# Patient Record
Sex: Male | Born: 1970 | Race: White | Hispanic: No | Marital: Married | Smoking: Former smoker
Health system: Southern US, Community
[De-identification: ages and names within clinical notes are randomized; demographics above are authoritative.]

## PROBLEM LIST (undated history)

## (undated) DIAGNOSIS — F410 Panic disorder [episodic paroxysmal anxiety] without agoraphobia: Secondary | ICD-10-CM

## (undated) DIAGNOSIS — E785 Hyperlipidemia, unspecified: Secondary | ICD-10-CM

## (undated) DIAGNOSIS — R5383 Other fatigue: Secondary | ICD-10-CM

## (undated) DIAGNOSIS — E669 Obesity, unspecified: Secondary | ICD-10-CM

## (undated) DIAGNOSIS — I10 Essential (primary) hypertension: Secondary | ICD-10-CM

## (undated) DIAGNOSIS — Z87898 Personal history of other specified conditions: Secondary | ICD-10-CM

## (undated) DIAGNOSIS — K279 Peptic ulcer, site unspecified, unspecified as acute or chronic, without hemorrhage or perforation: Secondary | ICD-10-CM

## (undated) DIAGNOSIS — F439 Reaction to severe stress, unspecified: Secondary | ICD-10-CM

## (undated) HISTORY — DX: Reaction to severe stress, unspecified: F43.9

## (undated) HISTORY — DX: Essential (primary) hypertension: I10

## (undated) HISTORY — DX: Panic disorder (episodic paroxysmal anxiety): F41.0

## (undated) HISTORY — DX: Other fatigue: R53.83

## (undated) HISTORY — DX: Personal history of other specified conditions: Z87.898

## (undated) HISTORY — DX: Peptic ulcer, site unspecified, unspecified as acute or chronic, without hemorrhage or perforation: K27.9

## (undated) HISTORY — PX: APPENDECTOMY: SHX54

## (undated) HISTORY — DX: Hyperlipidemia, unspecified: E78.5

## (undated) HISTORY — DX: Obesity, unspecified: E66.9

---

## 2001-12-17 ENCOUNTER — Encounter: Payer: Self-pay | Admitting: Family Medicine

## 2001-12-17 ENCOUNTER — Ambulatory Visit (HOSPITAL_COMMUNITY): Admission: RE | Admit: 2001-12-17 | Discharge: 2001-12-17 | Payer: Self-pay | Admitting: Family Medicine

## 2002-09-08 ENCOUNTER — Ambulatory Visit (HOSPITAL_COMMUNITY): Admission: RE | Admit: 2002-09-08 | Discharge: 2002-09-08 | Payer: Self-pay | Admitting: Internal Medicine

## 2002-09-11 ENCOUNTER — Encounter: Payer: Self-pay | Admitting: Internal Medicine

## 2002-09-11 ENCOUNTER — Encounter (HOSPITAL_COMMUNITY): Admission: RE | Admit: 2002-09-11 | Discharge: 2002-10-11 | Payer: Self-pay | Admitting: Internal Medicine

## 2002-09-20 ENCOUNTER — Inpatient Hospital Stay (HOSPITAL_COMMUNITY): Admission: EM | Admit: 2002-09-20 | Discharge: 2002-09-21 | Payer: Self-pay | Admitting: Emergency Medicine

## 2002-09-21 ENCOUNTER — Encounter: Payer: Self-pay | Admitting: Family Medicine

## 2002-09-28 ENCOUNTER — Encounter: Payer: Self-pay | Admitting: Internal Medicine

## 2002-12-04 ENCOUNTER — Inpatient Hospital Stay (HOSPITAL_COMMUNITY): Admission: EM | Admit: 2002-12-04 | Discharge: 2002-12-08 | Payer: Self-pay | Admitting: Emergency Medicine

## 2002-12-04 ENCOUNTER — Encounter: Payer: Self-pay | Admitting: Cardiology

## 2010-02-07 ENCOUNTER — Emergency Department (HOSPITAL_COMMUNITY): Admission: EM | Admit: 2010-02-07 | Discharge: 2010-02-07 | Payer: Self-pay | Admitting: Emergency Medicine

## 2010-02-15 ENCOUNTER — Ambulatory Visit (HOSPITAL_COMMUNITY): Admission: RE | Admit: 2010-02-15 | Discharge: 2010-02-15 | Payer: Self-pay | Admitting: Urology

## 2010-11-27 LAB — URINALYSIS, ROUTINE W REFLEX MICROSCOPIC
Bilirubin Urine: NEGATIVE
Glucose, UA: NEGATIVE mg/dL
Protein, ur: NEGATIVE mg/dL
Urobilinogen, UA: 0.2 mg/dL (ref 0.0–1.0)

## 2010-11-27 LAB — URINE MICROSCOPIC-ADD ON

## 2011-01-26 NOTE — Cardiovascular Report (Signed)
   NAME:  Justin Webster, Justin Webster                          ACCOUNT NO.:  [id number]   MEDICAL RECORD NO.:  [id number]                   PATIENT TYPE:  INP   LOCATION:  3733                                 FACILITY:  [facility]   PHYSICIAN:  Arturo Morton. Riley Kill, M.D. [facility]         DATE OF BIRTH:  23-Apr-1971   DATE OF PROCEDURE:  DATE OF DISCHARGE:  12/08/2002                              CARDIAC CATHETERIZATION   INDICATIONS:  The patient is a pleasant 40 year old gentleman who presents  with recurrent chest pain.  He was seen by Dr. Daleen Squibb and subsequently  referred for cardiac catheterization.   PROCEDURES:  1. Left-heart catheterization.  2. Selective coronary arteriography.  3. Selective left ventriculography.   DESCRIPTION OF PROCEDURE:  The procedure was performed from the right  femoral artery using a 6-French catheter.  She tolerated the procedure well.  There were no complications.   HEMODYNAMIC DATA:  1. Central aortic pressure 129/79.  2. Left ventricular pressure 124/14.  3. No gradient on pullback across the aortic valve.   ANGIOGRAPHIC DATA:  1. Ventriculography was performed in the RAO projection.  Overall systolic     function was well preserved, and no segmental abnormalities or     contractions were identified.  2. The left main coronary artery was free of critical disease.  3. The left anterior descending artery coursed to the apex.  There was minor     luminal irregularity in the LAD.  No significant focal stenosis were     noted.  There were two smaller diagonal branches, both of which appeared free of  critical disease.  1. There is a large ramus intermedius that divides proximally and then also     divides distally.  The ramus intermedius is free of critical disease.  2. The circumflex follows the AV groove and then provides a smaller OM     system distally.  This is without critical disease.  3. The right coronary artery is a large-caliber vessel providing posterior  descending and posterolateral system.  No significant abnormalities are     noted.    CONCLUSION:  1. Normal left ventricular function.  2. No significant coronary abnormalities.   The patient will be discharged tomorrow with followup with Dr. Patrica Duel.  We may obtain a D-Dimer.                                               Arturo Morton. Riley Kill, M.D. [facility]    TDS/MEDQ  D:  12/07/2002  T:  12/08/2002  Job:  [id number]   cc:   Patrica Duel, M.D.  [address]  [place]  [place] [address]  Fax: [phone]   Jesse Sans. Memon, M.D. [facility]

## 2011-01-26 NOTE — H&P (Signed)
NAME:  VENSON, FERENCZ                          ACCOUNT NO.:  [id number]   MEDICAL RECORD NO.:  [id number]                   PATIENT TYPE:  INP   LOCATION:  A332                                 FACILITY:  [facility]   PHYSICIAN:  Marden Noble, M.D.                DATE OF BIRTH:  [birth year]   DATE OF ADMISSION:  09/20/2002  DATE OF DISCHARGE:                                HISTORY & PHYSICAL   CHIEF COMPLAINT:  Vomiting/diarrhea x24 hours.   HISTORY OF PRESENT ILLNESS:  The patient is a 40 year old white male with a  history of hypertension and high cholesterol and abdominal discomfort who  presented after approximately 24 hours of having nausea, vomiting, and  diarrhea.  The patient states that he has had emesis and diarrhea more than  10 episodes prior to coming to the emergency room.  The patient stated that  it seems like every hour I was throwing up and having diarrhea.  The  patient feels that this episode was caused after the patient ate a can of  herring on the day prior to admission that he said smelled foul.  The  patient says he has also had some epigastric discomfort that he describes as  a pressure without any radiation.  The patient denies any fever.  He states  he has had subjective chills.  The patient does not recall his last episode  of urine prior to coming to the emergency room.  While here, the patient did  not have any episodes of emesis or diarrhea.   PAST MEDICAL HISTORY:  Past medical history is of hypertension, high  cholesterol, the patient had an EGD done September 08, 2002 showed a normal  esophagus, two prepyloric ulcers and he was H. pylori negative.  I believe  the patient has also had a HIDA scan which was ordered and the patient says  that it was normal.   PAST SURGICAL HISTORY:  No history of surgeries.   MEDICATIONS:  1. Lipitor 10 mg p.o. once daily.  2. Aciphex 20 mg p.o. once daily.  3. Altace 10 mg p.o. once daily.  4. Xanax 1 mg p.o. q.a.m.  and p.r.n. anxiety.   SOCIAL HISTORY:  The patient does not drink or smoke.  He is married, and he  is a Nutritional therapist by profession.   FAMILY HISTORY:  His father has ulcers, hypertension, diabetes, high  cholesterol, had an MI at the age of 10.   PHYSICAL EXAMINATION:  VITAL SIGNS:  Temperature is 96.7, blood pressure  when he came in was 78/50, pulse was 143, respirations 24.  GENERAL:  When I saw the patient, he was in no acute distress.  Very  pleasant.  HEART:  Tachycardic; cannot appreciate murmurs, rubs, or gallops.  LUNGS:  Clear to auscultation bilaterally without any wheezes, rales, or  rhonchi.  ABDOMEN:  Soft, some discomfort in the epigastric region; no  rebound or  guarding; no masses palpated.  EXTREMITIES:  No clubbing, cyanosis, or edema.  HEENT:  Oral mucosa was dry, there were no lesions noted; no gross  abnormalities of his tongue.  NEUROLOGICAL:  Nonfocal exam.   LABORATORIES:  Sodium was 133, potassium 4.8, chloride 100, bicarb 22, BUN  31, creatinine 4.2 and glucose was 179.  His lipase is normal at 30, amylase  was 79, albumin 4.4, AST was 48, ALT was 85, alkaline phosphatase was 66,  total bili is 0.8, direct bili less than 0.1 and direct was 0.8.  His white  count was 16, hemoglobin 15, hematocrit 47, platelet count was 240 with 78%  neutrophils.   IMPRESSION:  1. Dehydration secondary to gastroenteritis causing the patient to have some     nausea and vomiting and diarrhea.  We will treat him symptomatically,     aggressively give him intravenous fluids, give him intravenous proton     pump inhibitor, make him nothing by mouth, and he will be seen by     gastroenterology which we will consult.  2. The patient usually has hypertension, however, he is hypotensive at the     time.  We will aggressively rehydrate him and hold his blood pressure     medications for now.   DISPOSITION:  The patient will be placed on Dr. Geanie Logan service.                                                Marden Noble, M.D.    JD/MEDQ  D:  09/20/2002  T:  09/20/2002  Job:  [id number]

## 2011-01-26 NOTE — Discharge Summary (Signed)
   NAME:  Justin Webster, Justin Webster                          ACCOUNT NO.:  [id number]   MEDICAL RECORD NO.:  [id number]                   PATIENT TYPE:  INP   LOCATION:  A332                                 FACILITY:  [facility]   PHYSICIAN:  Patrica Duel, M.D.                 DATE OF BIRTH:  07-May-1971   DATE OF ADMISSION:  09/20/2002  DATE OF DISCHARGE:  09/21/2002                                 DISCHARGE SUMMARY   DISCHARGE DIAGNOSES:  1. Acute gastroenteritis.  2. Renal insufficiency, possible prerenal component plus/minus ACE inhibitor     effect, improving.  3. Hyperlipidemia.  4. Chronic gastroesophageal reflux disease, workup as an outpatient in     progress.   HISTORY OF PRESENT ILLNESS:  For details regarding admission please refer to  the admitting note.  Briefly, this 40 year old male with longstanding  hypertension and hyperlipidemia with adequate control developed the acute  onset of nausea, vomiting, and explosive diarrhea approximately two to three  hours after consuming a can of herring which apparently had a foul smell.  He presented to the emergency department for a workup, and he was found to  have a moderate leukocytosis of 16,000 with a left shift.  His electrolytes  were normal.  His BUN and creatinine were elevated at 34 and 4.3.  The  creatinine elevation was apparently a new finding.  Of note, the patient had  been maintained on ACE inhibitors chronically for his hypertension.   The patient was admitted for further evaluation, symptomatic therapy, and IV  fluids.   COURSE IN THE HOSPITAL:  The patient has done very well in the hospital,  with prompt resolution of his nausea, vomiting, and diarrhea.  Currently,  the patient is starving and ready for breakfast.  He is well hydrated,  fully alert, afebrile, and stable for discharge.  His creatinine has come  down to 2.3 overnight with IV fluids.   DISCHARGE INSTRUCTIONS:  The patient is to discontinue Altace and begin  Norvasc (samples to be given through the office).  A MET-7 will be redrawn  in two days.  Dr. Jena Gauss may continue outpatient workup for his abdominal  complaints.  Will follow and treat expectantly.                                               Patrica Duel, M.D.    MC/MEDQ  D:  09/21/2002  T:  09/21/2002  Job:  [id number]

## 2011-01-26 NOTE — Op Note (Signed)
NAME:  Justin Webster, Justin Webster                          ACCOUNT NO.:  [id number]   MEDICAL RECORD NO.:  [id number]                   PATIENT TYPE:  AMB   LOCATION:  DAY                                  FACILITY:  [facility]   PHYSICIAN:  R. Roetta Sessions, M.D.              DATE OF BIRTH:  24-Dec-1970   DATE OF PROCEDURE:  09/08/2002  DATE OF DISCHARGE:                                 OPERATIVE REPORT   PROCEDURE:  Diagnostic esophagogastroduodenoscopy.   INDICATIONS FOR PROCEDURE:  The patient is a 40 year old gentleman with  typical reflux symptoms that typically have been fairly well controlled with  Nexium and more recently Prevacid. He has been having more trouble with  lower retrosternal chest discomfort and upper abdominal pain particularly  post prandially. Prior gallbladder ultrasound demonstrated the gallbladder  polyp. He was started on Aciphex 20 mg orally daily. Thus far over the past  two weeks has not had much improvement in his symptoms. EGD is now being  done to further evaluate his symptoms.   He tells me he recently had the flu and took all kinds of medications  including a nonsteroidal containing agents.   MONITORING:  O2 saturation, blood pressure, pulse, and respirations were  monitored throughout the entire procedure.   CONSCIOUS SEDATION:  Versed 4 mg IV, Demerol 100 mg IV in divided doses.  Cetacaine spray for topical oropharyngeal anesthesia.   INSTRUMENTS:  Video chip gastroscope.   FINDINGS:  Examination of the tubular esophagus revealed no mucosal  abnormalities. The EG junction was easily traversed.   STOMACH:  The gastric cavity was empty and insufflated well with air.  Through examination of the gastric mucosa including a retroflexed view of  the proximal stomach and esophagogastric junction demonstrated two  prepyloric ulcers, one was approximately 2 cm, it was elliptical, skinny,  1/4 moon shaped ulcer and there was a second 3 x 5 mm ulcer in the  prepyloric mucosa. These appeared to be benign lesions of __________. The  remainder of the gastric mucosa appeared normal.  The pylorus was patent and  easily traversed.   DUODENUM:  The bulb and second portion appeared normal.   THERAPEUTIC/DIAGNOSTIC MANEUVERS:  None.   The patient tolerated the procedure well and was reacted in endoscopy.   IMPRESSION:  1. Normal esophagus.  2. Two prepyloric ulcers as described above. The remainder of the gastric     mucosa appeared normal. Normal D1 and D2.  Expect these are NSAID induced     ulcers. His post prandial abdominal pain is suspicious for gallbladder     disease.   RECOMMENDATIONS:  1. Continue Aciphex 20 mg orally daily for the time being, stop all NSAIDs.  2. H. pylori serologies.  3. Proceed with a HIDA scan with _________ challenge.  4. Further recommendations to follow.  [name], M.D.    RMR/MEDQ  D:  09/08/2002  T:  09/08/2002  Job:  [id number]   cc:   Patrica Duel, M.D.  [address]  [place]  [place] [address]  Fax: [phone]

## 2011-01-26 NOTE — Discharge Summary (Signed)
NAME:  Justin Webster, Justin Webster                          ACCOUNT NO.:  [id number]   MEDICAL RECORD NO.:  [id number]                   PATIENT TYPE:  INP   LOCATION:  3733                                 FACILITY:  [facility]   PHYSICIAN:  Thomas C. Libbey, M.D. [facility]            DATE OF BIRTH:  May 26, 1971   DATE OF ADMISSION:  12/04/2002  DATE OF DISCHARGE:  12/08/2002                                 DISCHARGE SUMMARY   DISCHARGE DIAGNOSES:  1. Non cardiac chest pain.  2. Hypertension.  3. Hyperlipidemia.  4. History of panic disorder.  5. Peptic ulcer disease.  6. Previous history of tobacco abuse.   PROCEDURE:  Cardiac catheterization by Arturo Morton. Riley Kill, M.D. [facility] revealing  normal coronary, normal LV, EF greater than 60%.   HOSPITAL COURSE:  Please see the dictated note by Jesse Sans. Wittner, M.D. [facility]  for complete details.  Briefly, this 40 year old male presented to the  emergency room with complaints of chest pain after lifting a well.  He is a  Nutritional therapist.  He was admitted and placed on Lovenox, aspirin, and beta blockers.  His EKG revealed no acute changes. D-dimer was negative at 0.22.  Cardiac  enzymes were negative x3.  The patient remained in the hospital throughout  the weekend and was stable.  The patient went for cardiac catheterization on  December 07, 2002, by Dr. Riley Kill. The results are noted above.  The patient  tolerated the procedure well and had no immediate complications.  On the  morning of December 08, 2002, he was found to be in stable condition and was  ready for discharge to home.  On admission, Lopressor was added for better  blood pressure control as well as anti-ischemia.  The patient's blood  pressure did well throughout his admission. Therefore he was discharged to  home on Toprol XL 25 mg a day. He will need to follow up with his primary  care physician.   LABORATORY DATA:  White blood cell count 7400, hemoglobin 13.1, hematocrit  37.8, platelet count 201,000.  INR 0.9.   DNR 0.22.  Sodium 141, potassium  3.9, chloride 108, CO2 27, glucose 96, BUN 11, creatinine 1.0.  Total  bilirubin 0.6, alkaline phosphatase 66, AST 41, ALT 63, total protein 6.4,  albumin 2.5, calcium 8.7.  Cardiac enzymes negative x3. TSH 0.896.   Chest x-ray on admission; mild cardiomegaly, no acute disease.   DISCHARGE MEDICATIONS:  1. Norvasc 10 mg daily.  2. Lipitor 10 mg q.h.s.  3. Coated aspirin 81 mg daily.  4. Toprol XL 25 mg daily.   ACTIVITY:  No driving, heavy lifting, exertion, or work for two days.   DIET:  Low fat and low sodium. The patient is to call the office for any  groin swelling, bleeding, or bruising.    FOLLOW UP:  He should see his primary care physician, Patrica Duel, M.D.,  in [place]  in one to two weeks to follow-up on chest pain and high blood  pressure.     Tereso Newcomer, P.A.                        Thomas C. Daleen Squibb, M.D. [facility]    SW/MEDQ  D:  12/08/2002  T:  12/08/2002  Job:  [id number]   cc:   Patrica Duel, M.D.  [address]  [place]  [place] [address]  Fax: [phone]

## 2011-01-26 NOTE — H&P (Signed)
NAME:  Justin Webster, Justin Webster                          ACCOUNT NO.:  [id number]   MEDICAL RECORD NO.:  [id number]                   PATIENT TYPE:  INP   LOCATION:  3733                                 FACILITY:  [facility]   PHYSICIAN:  Thomas C. Bunten, M.D. 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   HISTORY OF PRESENT ILLNESS:  The patient is a 40 year old, white male with  multiple cardiac risk factors including premature family history of coronary  disease, hypertension, hyperlipidemia and recently stopped smoking.  He has  been complaining of some substernal chest tightness while working on a well.  Last night, he woke up a few times with some shortness of breath as well.  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   Nitroglycerin spray helped his chest pain and his chest pain went away after  about four sprays.  He also took a Xanax because of a history of anxiety  disorder.   PAST MEDICAL HISTORY:  1. Hypertension.  2. Hyperlipidemia.  3. Panic disorder.  4. Peptic ulcer disease.   ALLERGIES:  No known drug allergies.   MEDICATIONS:  1. Norvasc 10 mg a day.  2. Lipitor 10 mg q.h.s.  3. Over-the-counter antacids.  4. Xanax p.r.n.   SOCIAL HISTORY:  He lives in [place] with his wife.  He is Nutritional therapist.  He  is married and has one 4-year-old son.  He enjoys a couple of beers a night.  He quit smoking in December.  He has a 15-pack-year smoking history.   FAMILY HISTORY:  His father has coronary disease and had a MI in his mid  15s.   REVIEW OF SYMPTOMS:  Unremarkable other than the HPI.   PHYSICAL EXAMINATION:  VITAL SIGNS:  Blood pressure 153/92, pulse 92 and  regular, respirations 12, temperature 99.8.  Saturations 99% on room air.  GENERAL:  Well-developed, well-nourished without any acute distress.  HEENT:  Unremarkable.  NECK:  Supple with good carotid upstrokes.  There is no JVD.  There are no  bruits.  Thyroid is not enlarged.  HEART:  Regular rate and rhythm without murmurs, rubs or gallops.  LUNGS:  Clear to auscultation and percussion.  ABDOMEN:  Soft, good bowel sounds.  There is no obvious hepatomegaly or  splenomegaly.  EXTREMITIES:  No clubbing, cyanosis or edema.  Pulses were brisk  bilaterally.  There was no sign of DVT or phlebitis.  NEUROLOGIC:  Grossly intact.   LABORATORY DATA AND X-RAY FINDINGS:  Chest x-ray is pending.  EKG shows a  rate of 97 with  an incomplete right bundle branch block pattern.   Preliminary blood work is pending.  His first CK is 387.   ASSESSMENT/PLAN:  1. Chest pain with numerous cardiac risk factors quite worrisome for     ischemia.  Will admit and rule out for myocardial infarction and plan on     catheterization on Monday.  He agrees with this plan.  2. Hypertension.  3. Hyperlipidemia.  4. Ex-smoker.  5. Family history of coronary artery disease.  6. History of peptic ulcer disease.  7. Panic disorder.                                               Thomas C. Daleen Squibb, M.D. [facility]    TCW/MEDQ  D:  12/04/2002  T:  12/05/2002  Job:  [id number]   cc:   Patrica Duel, M.D.  [address]  [place]  [place] [address]  Fax: [phone]

## 2013-01-20 ENCOUNTER — Other Ambulatory Visit (HOSPITAL_COMMUNITY): Payer: Self-pay | Admitting: Cardiovascular Disease

## 2013-01-20 DIAGNOSIS — R079 Chest pain, unspecified: Secondary | ICD-10-CM

## 2013-01-20 DIAGNOSIS — I10 Essential (primary) hypertension: Secondary | ICD-10-CM

## 2013-01-20 DIAGNOSIS — R011 Cardiac murmur, unspecified: Secondary | ICD-10-CM

## 2013-01-23 ENCOUNTER — Other Ambulatory Visit: Payer: Self-pay | Admitting: Cardiovascular Disease

## 2013-01-23 LAB — COMPREHENSIVE METABOLIC PANEL
ALT: 77 U/L — ABNORMAL HIGH (ref 0–53)
AST: 53 U/L — ABNORMAL HIGH (ref 0–37)
Albumin: 4.4 g/dL (ref 3.5–5.2)
BUN: 12 mg/dL (ref 6–23)
CO2: 24 mEq/L (ref 19–32)
Calcium: 9.3 mg/dL (ref 8.4–10.5)
Chloride: 103 mEq/L (ref 96–112)
Creat: 0.88 mg/dL (ref 0.50–1.35)
Potassium: 4.1 mEq/L (ref 3.5–5.3)

## 2013-01-23 LAB — LIPID PANEL
Cholesterol: 236 mg/dL — ABNORMAL HIGH (ref 0–200)
Total CHOL/HDL Ratio: 5.6 Ratio

## 2013-01-27 ENCOUNTER — Encounter (HOSPITAL_COMMUNITY): Payer: Self-pay

## 2013-01-30 ENCOUNTER — Ambulatory Visit (HOSPITAL_COMMUNITY)
Admission: RE | Admit: 2013-01-30 | Discharge: 2013-01-30 | Disposition: A | Discharge status: Home / Self Care | Payer: BC Managed Care – PPO | Source: Ambulatory Visit | Attending: Cardiovascular Disease | Admitting: Cardiovascular Disease

## 2013-01-30 ENCOUNTER — Encounter (HOSPITAL_COMMUNITY): Payer: Self-pay

## 2013-01-30 DIAGNOSIS — E785 Hyperlipidemia, unspecified: Secondary | ICD-10-CM | POA: Insufficient documentation

## 2013-01-30 DIAGNOSIS — R011 Cardiac murmur, unspecified: Secondary | ICD-10-CM | POA: Insufficient documentation

## 2013-01-30 DIAGNOSIS — I059 Rheumatic mitral valve disease, unspecified: Secondary | ICD-10-CM | POA: Insufficient documentation

## 2013-01-30 DIAGNOSIS — I079 Rheumatic tricuspid valve disease, unspecified: Secondary | ICD-10-CM | POA: Insufficient documentation

## 2013-01-30 DIAGNOSIS — K219 Gastro-esophageal reflux disease without esophagitis: Secondary | ICD-10-CM | POA: Insufficient documentation

## 2013-01-30 DIAGNOSIS — I379 Nonrheumatic pulmonary valve disorder, unspecified: Secondary | ICD-10-CM | POA: Insufficient documentation

## 2013-01-30 DIAGNOSIS — I10 Essential (primary) hypertension: Secondary | ICD-10-CM | POA: Insufficient documentation

## 2013-01-30 DIAGNOSIS — R079 Chest pain, unspecified: Secondary | ICD-10-CM

## 2013-01-30 MED ORDER — TECHNETIUM TC 99M SESTAMIBI GENERIC - CARDIOLITE
10.3000 | Freq: Once | INTRAVENOUS | Status: AC | PRN
  Administered 2013-01-30: 10 via INTRAVENOUS

## 2013-01-30 MED ORDER — TECHNETIUM TC 99M SESTAMIBI GENERIC - CARDIOLITE
31.4000 | Freq: Once | INTRAVENOUS | Status: AC | PRN
  Administered 2013-01-30: 31 via INTRAVENOUS

## 2013-01-30 NOTE — Progress Notes (Signed)
[facility]   2D echo completed 01/30/2013.   Cindy Astou Lada, RDCS  

## 2013-01-30 NOTE — Procedures (Addendum)
[facility] [address] [place] [address] [phone]  Cardiology Nuclear Med Study  Justin Webster is a 42 y.o. male     MRN : [id number]     DOB: 1970-10-16  Procedure Date: 01/30/2013  Nuclear Med Background Indication for Stress Test:  Evaluation for Ischemia History:  ptca--2004 Cardiac Risk Factors: Family History - CAD, History of Smoking, Hypertension, Lipids and Obesity  Symptoms:  Chest Pain, DOE and SOB   Nuclear Pre-Procedure Caffeine/Decaff Intake:  7:00pm NPO After: 5:00am   IV Site: R Hand  IV 0.9% NS with Angio Cath:  22g  Chest Size (in):  46"  IV Started by: Emmit Pomfret, RN  Height: 6' (1.829 m)  Cup Size: n/a  BMI:  Body mass index is 34.85 kg/(m^2). Weight:  257 lb (116.574 kg)   Tech Comments:  N/a     Nuclear Med Study 1 or 2 day study: 1 day  Stress Test Type:  Stress  Order Authorizing Provider:  ricahrd weintraub, md   Resting Radionuclide: Technetium 61m Sestamibi  Resting Radionuclide Dose: 10.3 mCi   Stress Radionuclide:  Technetium 85m Sestamibi  Stress Radionuclide Dose: 31.4 mCi           Stress Protocol Rest HR: 78 Stress HR: 162  Rest BP: 141/86 Stress BP: 219/78  Exercise Time (min): 6:30 METS: 7.7   Predicted Max HR: 177 bpm % Max HR: 91.53 bpm Rate Pressure Product: 86578  Dose of Adenosine (mg):  n/a Dose of Lexiscan: n/a mg  Dose of Atropine (mg): n/a Dose of Dobutamine: n/a mcg/kg/min (at max HR)  Stress Test Technologist: Esperanza Sheets, CCT Nuclear Technologist: Gonzella Lex, CNMT   Rest Procedure:  Myocardial perfusion imaging was performed at rest 45 minutes following the intravenous administration of Technetium 60m Sestamibi. Stress Procedure:  The patient performed treadmill exercise using a Bruce  Protocol for 6:30 minutes. The patient stopped due to SOB and Leg Discomfort and denied any chest pain.  There were no significant ST-T wave changes.   Technetium 27m Sestamibi was injected at peak exercise and myocardial perfusion imaging was performed after a brief delay.  Transient Ischemic Dilatation (Normal <1.22):  0.88 Lung/Heart Ratio (Normal <0.45):  0.36 QGS EDV:  127 ml QGS ESV:  46 ml LV Ejection Fraction: 64%     Rest ECG: NSR - Normal EKG  Stress ECG: There are scattered PVCs. No pathological ST changes  QPS Raw Data Images:  Normal; no motion artifact; normal heart/lung ratio. Stress Images:  Normal homogeneous uptake in all areas of the myocardium. Rest Images:  Normal homogeneous uptake in all areas of the myocardium. Subtraction (SDS):  No evidence of ischemia.  Impression Exercise Capacity:  Fair exercise capacity. BP Response:  Hypertensive blood pressure response. Clinical Symptoms:  No significant symptoms noted. ECG Impression:  No significant ST segment change suggestive of ischemia. Comparison with Prior Nuclear Study: No previous nuclear study performed  Overall Impression:  Normal stress nuclear study.  LV [name] Motion:  NL LV Function; NL [name] Motion   [name], MD  01/30/2013 12:51 PM

## 2013-02-19 ENCOUNTER — Encounter: Payer: Self-pay | Admitting: Cardiovascular Disease

## 2013-04-17 ENCOUNTER — Other Ambulatory Visit: Payer: Self-pay | Admitting: Cardiovascular Disease

## 2013-04-17 LAB — LIPID PANEL
Cholesterol: 201 mg/dL — ABNORMAL HIGH (ref 0–200)
HDL: 40 mg/dL (ref >39)
Total CHOL/HDL Ratio: 5 Ratio

## 2013-04-17 LAB — LDL CHOLESTEROL, DIRECT: Direct LDL: 76 mg/dL

## 2013-07-02 ENCOUNTER — Encounter (HOSPITAL_COMMUNITY): Payer: Self-pay | Admitting: Anesthesiology

## 2013-07-02 ENCOUNTER — Ambulatory Visit (HOSPITAL_COMMUNITY)
Admission: RE | Admit: 2013-07-02 | Discharge: 2013-07-02 | Disposition: A | Discharge status: Home / Self Care | Payer: BC Managed Care – PPO | Source: Ambulatory Visit | Attending: Family Medicine | Admitting: Family Medicine

## 2013-07-02 ENCOUNTER — Encounter (HOSPITAL_COMMUNITY): Payer: BC Managed Care – PPO | Admitting: Anesthesiology

## 2013-07-02 ENCOUNTER — Observation Stay (HOSPITAL_COMMUNITY)
Admission: EM | Admit: 2013-07-02 | Discharge: 2013-07-03 | Disposition: A | Discharge status: Home / Self Care | Payer: BC Managed Care – PPO | Attending: General Surgery | Admitting: General Surgery

## 2013-07-02 ENCOUNTER — Encounter (HOSPITAL_COMMUNITY)
Admission: EM | Disposition: A | Discharge status: Home / Self Care | Payer: Self-pay | Source: Home / Self Care | Attending: Emergency Medicine

## 2013-07-02 ENCOUNTER — Other Ambulatory Visit (HOSPITAL_COMMUNITY): Payer: Self-pay | Admitting: Family Medicine

## 2013-07-02 ENCOUNTER — Emergency Department (HOSPITAL_COMMUNITY): Payer: BC Managed Care – PPO | Admitting: Anesthesiology

## 2013-07-02 DIAGNOSIS — R1031 Right lower quadrant pain: Secondary | ICD-10-CM | POA: Insufficient documentation

## 2013-07-02 DIAGNOSIS — Z01812 Encounter for preprocedural laboratory examination: Secondary | ICD-10-CM | POA: Insufficient documentation

## 2013-07-02 DIAGNOSIS — K389 Disease of appendix, unspecified: Secondary | ICD-10-CM | POA: Insufficient documentation

## 2013-07-02 DIAGNOSIS — K3580 Unspecified acute appendicitis: Secondary | ICD-10-CM | POA: Insufficient documentation

## 2013-07-02 DIAGNOSIS — K37 Unspecified appendicitis: Secondary | ICD-10-CM

## 2013-07-02 HISTORY — PX: LAPAROSCOPIC APPENDECTOMY: SHX408

## 2013-07-02 LAB — COMPREHENSIVE METABOLIC PANEL
ALT: 56 U/L — ABNORMAL HIGH (ref 0–53)
Albumin: 3.9 g/dL (ref 3.5–5.2)
Calcium: 9.3 mg/dL (ref 8.4–10.5)
GFR calc Af Amer: >90 mL/min (ref >90)
Glucose, Bld: 115 mg/dL — ABNORMAL HIGH (ref 70–99)
Potassium: 3.5 mEq/L (ref 3.5–5.1)
Sodium: 134 mEq/L — ABNORMAL LOW (ref 135–145)
Total Protein: 7.7 g/dL (ref 6.0–8.3)

## 2013-07-02 LAB — CBC WITH DIFFERENTIAL/PLATELET
Basophils Absolute: 0 10*3/uL (ref 0.0–0.1)
Basophils Relative: 0 % (ref 0–1)
Eosinophils Absolute: 0 10*3/uL (ref 0.0–0.7)
Eosinophils Relative: 0 % (ref 0–5)
MCH: 32.4 pg (ref 26.0–34.0)
MCHC: 35 g/dL (ref 30.0–36.0)
MCV: 92.5 fL (ref 78.0–100.0)
Neutrophils Relative %: 84 % — ABNORMAL HIGH (ref 43–77)
Platelets: 186 10*3/uL (ref 150–400)
RDW: 13.5 % (ref 11.5–15.5)

## 2013-07-02 SURGERY — APPENDECTOMY, LAPAROSCOPIC
Anesthesia: General | Site: Abdomen | Wound class: Contaminated

## 2013-07-02 MED ORDER — ROCURONIUM BROMIDE 100 MG/10ML IV SOLN
INTRAVENOUS | Status: DC | PRN
  Administered 2013-07-02: 5 mg via INTRAVENOUS
  Administered 2013-07-02: 25 mg via INTRAVENOUS

## 2013-07-02 MED ORDER — OXYCODONE-ACETAMINOPHEN 5-325 MG PO TABS
1.0000 | ORAL_TABLET | ORAL | Status: DC | PRN
  Administered 2013-07-03 (×2): 2 via ORAL
  Filled 2013-07-02 (×2): qty 2

## 2013-07-02 MED ORDER — LACTATED RINGERS IV SOLN
INTRAVENOUS | Status: DC | PRN
  Administered 2013-07-02: 19:00:00 via INTRAVENOUS

## 2013-07-02 MED ORDER — FENTANYL CITRATE 0.05 MG/ML IJ SOLN
INTRAMUSCULAR | Status: AC
  Filled 2013-07-02: qty 2

## 2013-07-02 MED ORDER — LACTATED RINGERS IV SOLN
INTRAVENOUS | Status: DC
  Administered 2013-07-02: 22:00:00 via INTRAVENOUS

## 2013-07-02 MED ORDER — SUCCINYLCHOLINE CHLORIDE 20 MG/ML IJ SOLN
INTRAMUSCULAR | Status: DC | PRN
  Administered 2013-07-02: 120 mg via INTRAVENOUS

## 2013-07-02 MED ORDER — GLYCOPYRROLATE 0.2 MG/ML IJ SOLN
INTRAMUSCULAR | Status: DC | PRN
  Administered 2013-07-02: 0.6 mg via INTRAVENOUS

## 2013-07-02 MED ORDER — ONDANSETRON HCL 4 MG PO TABS: 4.0000 mg | ORAL_TABLET | Freq: Four times a day (QID) | ORAL | Status: DC | PRN

## 2013-07-02 MED ORDER — ONDANSETRON HCL 4 MG/2ML IJ SOLN
INTRAMUSCULAR | Status: AC
  Filled 2013-07-02: qty 2

## 2013-07-02 MED ORDER — LIDOCAINE HCL 1 % IJ SOLN
INTRAMUSCULAR | Status: DC | PRN
  Administered 2013-07-02: 30 mg via INTRADERMAL

## 2013-07-02 MED ORDER — LIDOCAINE HCL (PF) 1 % IJ SOLN
INTRAMUSCULAR | Status: AC
  Filled 2013-07-02: qty 5

## 2013-07-02 MED ORDER — PROPOFOL 10 MG/ML IV BOLUS
INTRAVENOUS | Status: DC | PRN
  Administered 2013-07-02: 200 mg via INTRAVENOUS

## 2013-07-02 MED ORDER — METOPROLOL SUCCINATE ER 25 MG PO TB24
25.0000 mg | ORAL_TABLET | Freq: Every day | ORAL | Status: DC
  Administered 2013-07-03: 25 mg via ORAL
  Filled 2013-07-02: qty 1

## 2013-07-02 MED ORDER — 0.9 % SODIUM CHLORIDE (POUR BTL) OPTIME
TOPICAL | Status: DC | PRN
  Administered 2013-07-02: 1000 mL

## 2013-07-02 MED ORDER — PROPOFOL 10 MG/ML IV EMUL
INTRAVENOUS | Status: AC
  Filled 2013-07-02: qty 20

## 2013-07-02 MED ORDER — PIPERACILLIN-TAZOBACTAM 3.375 G IVPB
INTRAVENOUS | Status: AC
Start: 2013-07-02 — End: 2013-07-02
  Filled 2013-07-02: qty 50

## 2013-07-02 MED ORDER — IOHEXOL 300 MG/ML  SOLN
100.0000 mL | Freq: Once | INTRAMUSCULAR | Status: AC | PRN
  Administered 2013-07-02: 100 mL via INTRAVENOUS

## 2013-07-02 MED ORDER — ENOXAPARIN SODIUM 30 MG/0.3ML ~~LOC~~ SOLN
30.0000 mg | Freq: Once | SUBCUTANEOUS | Status: AC
  Administered 2013-07-02: 30 mg via SUBCUTANEOUS
  Filled 2013-07-02: qty 0.3

## 2013-07-02 MED ORDER — ONDANSETRON HCL 4 MG/2ML IJ SOLN
INTRAMUSCULAR | Status: DC | PRN
  Administered 2013-07-02: 4 mg via INTRAVENOUS

## 2013-07-02 MED ORDER — PIPERACILLIN-TAZOBACTAM 3.375 G IVPB
3.3750 g | Freq: Once | INTRAVENOUS | Status: AC
  Administered 2013-07-02: 3.375 g via INTRAVENOUS
  Filled 2013-07-02: qty 50

## 2013-07-02 MED ORDER — SUCCINYLCHOLINE CHLORIDE 20 MG/ML IJ SOLN
INTRAMUSCULAR | Status: AC
  Filled 2013-07-02: qty 1

## 2013-07-02 MED ORDER — ONDANSETRON HCL 4 MG/2ML IJ SOLN: 4.0000 mg | Freq: Four times a day (QID) | INTRAMUSCULAR | Status: DC | PRN

## 2013-07-02 MED ORDER — MIDAZOLAM HCL 2 MG/2ML IJ SOLN
INTRAMUSCULAR | Status: AC
  Filled 2013-07-02: qty 2

## 2013-07-02 MED ORDER — PIPERACILLIN-TAZOBACTAM 3.375 G IVPB
3.3750 g | Freq: Three times a day (TID) | INTRAVENOUS | Status: DC
  Administered 2013-07-03: 3.375 g via INTRAVENOUS
  Filled 2013-07-02 (×2): qty 50

## 2013-07-02 MED ORDER — ENOXAPARIN SODIUM 40 MG/0.4ML ~~LOC~~ SOLN: 40.0000 mg | SUBCUTANEOUS | Status: DC

## 2013-07-02 MED ORDER — MIDAZOLAM HCL 5 MG/5ML IJ SOLN
INTRAMUSCULAR | Status: DC | PRN
  Administered 2013-07-02: 2 mg via INTRAVENOUS

## 2013-07-02 MED ORDER — HYDROMORPHONE HCL PF 1 MG/ML IJ SOLN
1.0000 mg | Freq: Once | INTRAMUSCULAR | Status: AC
  Administered 2013-07-02: 1 mg via INTRAVENOUS
  Filled 2013-07-02: qty 1

## 2013-07-02 MED ORDER — ROCURONIUM BROMIDE 50 MG/5ML IV SOLN
INTRAVENOUS | Status: AC
  Filled 2013-07-02: qty 1

## 2013-07-02 MED ORDER — BUPIVACAINE HCL (PF) 0.5 % IJ SOLN
INTRAMUSCULAR | Status: AC
  Filled 2013-07-02: qty 30

## 2013-07-02 MED ORDER — KETOROLAC TROMETHAMINE 30 MG/ML IJ SOLN: 30.0000 mg | Freq: Once | INTRAMUSCULAR | Status: DC

## 2013-07-02 MED ORDER — FENTANYL CITRATE 0.05 MG/ML IJ SOLN
INTRAMUSCULAR | Status: DC | PRN
  Administered 2013-07-02 (×7): 50 ug via INTRAVENOUS

## 2013-07-02 MED ORDER — ONDANSETRON HCL 4 MG/2ML IJ SOLN
4.0000 mg | Freq: Once | INTRAMUSCULAR | Status: AC
  Administered 2013-07-02: 4 mg via INTRAVENOUS
  Filled 2013-07-02: qty 2

## 2013-07-02 MED ORDER — NEOSTIGMINE METHYLSULFATE 1 MG/ML IJ SOLN
INTRAMUSCULAR | Status: DC | PRN
  Administered 2013-07-02: 3 mg via INTRAVENOUS

## 2013-07-02 MED ORDER — FENTANYL CITRATE 0.05 MG/ML IJ SOLN
INTRAMUSCULAR | Status: AC
  Filled 2013-07-02: qty 5

## 2013-07-02 MED ORDER — BUPIVACAINE HCL (PF) 0.5 % IJ SOLN
INTRAMUSCULAR | Status: DC | PRN
  Administered 2013-07-02: 10 mL

## 2013-07-02 MED ORDER — HYDROMORPHONE HCL PF 1 MG/ML IJ SOLN
1.0000 mg | INTRAMUSCULAR | Status: DC | PRN
  Administered 2013-07-02: 1 mg via INTRAVENOUS
  Filled 2013-07-02: qty 1

## 2013-07-02 MED ORDER — GLYCOPYRROLATE 0.2 MG/ML IJ SOLN
INTRAMUSCULAR | Status: AC
  Filled 2013-07-02: qty 3

## 2013-07-02 SURGICAL SUPPLY — 50 items
BAG HAMPER (MISCELLANEOUS) ×2 IMPLANT
BAG SPEC RTRVL LRG 6X4 10 (ENDOMECHANICALS) ×1
CLOTH BEACON ORANGE TIMEOUT ST (SAFETY) ×2 IMPLANT
COVER LIGHT HANDLE STERIS (MISCELLANEOUS) ×4 IMPLANT
CUTTER FLEX LINEAR 45M (STAPLE) IMPLANT
CUTTER LINEAR ENDO 35 ETS (STAPLE) ×1 IMPLANT
CUTTER LINEAR ENDO 35 ETS TH (STAPLE) IMPLANT
DECANTER SPIKE VIAL GLASS SM (MISCELLANEOUS) ×2 IMPLANT
DISSECTOR BLUNT TIP ENDO 5MM (MISCELLANEOUS) IMPLANT
DURAPREP 26ML APPLICATOR (WOUND CARE) ×2 IMPLANT
ELECT REM PT RETURN 9FT ADLT (ELECTROSURGICAL) ×2
ELECTRODE REM PT RTRN 9FT ADLT (ELECTROSURGICAL) ×1 IMPLANT
FILTER SMOKE EVAC LAPAROSHD (FILTER) ×2 IMPLANT
FORMALIN 10 PREFIL 120ML (MISCELLANEOUS) ×2 IMPLANT
GLOVE BIO SURGEON STRL SZ7.5 (GLOVE) ×2 IMPLANT
GLOVE BIOGEL PI IND STRL 7.0 (GLOVE) IMPLANT
GLOVE BIOGEL PI IND STRL 7.5 (GLOVE) IMPLANT
GLOVE BIOGEL PI INDICATOR 7.0 (GLOVE) ×1
GLOVE BIOGEL PI INDICATOR 7.5 (GLOVE) ×2
GLOVE ECLIPSE 7.0 STRL STRAW (GLOVE) ×2 IMPLANT
GLOVE ECLIPSE 7.5 STRL STRAW (GLOVE) ×2 IMPLANT
GOWN STRL REIN XL XLG (GOWN DISPOSABLE) ×4 IMPLANT
INST SET LAPROSCOPIC AP (KITS) ×2 IMPLANT
IV NS IRRIG 3000ML ARTHROMATIC (IV SOLUTION) IMPLANT
KIT ROOM TURNOVER APOR (KITS) ×2 IMPLANT
MANIFOLD NEPTUNE II (INSTRUMENTS) ×2 IMPLANT
NDL INSUFFLATION 14GA 120MM (NEEDLE) ×1 IMPLANT
NEEDLE INSUFFLATION 14GA 120MM (NEEDLE) ×2 IMPLANT
NS IRRIG 1000ML POUR BTL (IV SOLUTION) ×2 IMPLANT
PACK LAP CHOLE LZT030E (CUSTOM PROCEDURE TRAY) ×2 IMPLANT
PAD ARMBOARD 7.5X6 YLW CONV (MISCELLANEOUS) ×2 IMPLANT
POUCH SPECIMEN RETRIEVAL 10MM (ENDOMECHANICALS) ×2 IMPLANT
RELOAD /EVU35 (ENDOMECHANICALS) IMPLANT
RELOAD 45 VASCULAR/THIN (ENDOMECHANICALS) IMPLANT
RELOAD CUTTER ETS 35MM STAND (ENDOMECHANICALS) IMPLANT
RELOAD STAPLE 45 2.5 WHT GRN (ENDOMECHANICALS) IMPLANT
SCALPEL HARMONIC ACE (MISCELLANEOUS) ×2 IMPLANT
SET BASIN LINEN APH (SET/KITS/TRAYS/PACK) ×2 IMPLANT
SET TUBE IRRIG SUCTION NO TIP (IRRIGATION / IRRIGATOR) IMPLANT
SPONGE GAUZE 2X2 8PLY STRL LF (GAUZE/BANDAGES/DRESSINGS) ×6 IMPLANT
STAPLER VISISTAT (STAPLE) ×2 IMPLANT
SUT VICRYL 0 UR6 27IN ABS (SUTURE) ×2 IMPLANT
TAPE CLOTH SURG 4X10 WHT LF (GAUZE/BANDAGES/DRESSINGS) ×1 IMPLANT
TRAY FOLEY CATH 16FR SILVER (SET/KITS/TRAYS/PACK) ×2 IMPLANT
TROCAR ENDO BLADELESS 11MM (ENDOMECHANICALS) ×2 IMPLANT
TROCAR ENDO BLADELESS 12MM (ENDOMECHANICALS) ×2 IMPLANT
TROCAR XCEL NON-BLD 5MMX100MML (ENDOMECHANICALS) ×2 IMPLANT
TUBING INSUFFLATION (TUBING) ×2 IMPLANT
WARMER LAPAROSCOPE (MISCELLANEOUS) ×2 IMPLANT
YANKAUER SUCT 12FT TUBE ARGYLE (SUCTIONS) ×2 IMPLANT

## 2013-07-02 NOTE — H&P (Signed)
Justin Webster is an 42 y.o. male.   Chief Complaint: Right lower quadrant abdominal pain HPI:  Patient is a 42 year old white male who presents to the emergency room with a less than 12 hour history of worsening right lower quadrant abdominal pain. An outpatient CT scan of the abdomen revealed acute appendicitis.   No past medical history on file.  No past surgical history on file.  No family history on file. Social History:  has no tobacco, alcohol, and drug history on file.  Allergies: No Known Allergies   (Not in a hospital admission)  Results for orders placed during the hospital encounter of 07/02/13 (from the past 48 hour(s))  CBC WITH DIFFERENTIAL     Status: Abnormal   Collection Time    07/02/13  5:46 PM      Result Value Range   WBC 17.1 (*) 4.0 - 10.5 K/uL   RBC 4.42  4.22 - 5.81 MIL/uL   Hemoglobin 14.3  13.0 - 17.0 g/dL   HCT 13.0  86.5 - 78.4 %   MCV 92.5  78.0 - 100.0 fL   MCH 32.4  26.0 - 34.0 pg   MCHC 35.0  30.0 - 36.0 g/dL   RDW 69.6  29.5 - 28.4 %   Platelets 186  150 - 400 K/uL   Neutrophils Relative % 84 (*) 43 - 77 %   Neutro Abs 14.3 (*) 1.7 - 7.7 K/uL   Lymphocytes Relative 8 (*) 12 - 46 %   Lymphs Abs 1.4  0.7 - 4.0 K/uL   Monocytes Relative 8  3 - 12 %   Monocytes Absolute 1.4 (*) 0.1 - 1.0 K/uL   Eosinophils Relative 0  0 - 5 %   Eosinophils Absolute 0.0  0.0 - 0.7 K/uL   Basophils Relative 0  0 - 1 %   Basophils Absolute 0.0  0.0 - 0.1 K/uL  COMPREHENSIVE METABOLIC PANEL     Status: Abnormal   Collection Time    07/02/13  5:46 PM      Result Value Range   Sodium 134 (*) 135 - 145 mEq/L   Potassium 3.5  3.5 - 5.1 mEq/L   Chloride 96  96 - 112 mEq/L   CO2 24  19 - 32 mEq/L   Glucose, Bld 115 (*) 70 - 99 mg/dL   BUN 7  6 - 23 mg/dL   Creatinine, Ser 1.32  0.50 - 1.35 mg/dL   Calcium 9.3  8.4 - 44.0 mg/dL   Total Protein 7.7  6.0 - 8.3 g/dL   Albumin 3.9  3.5 - 5.2 g/dL   AST 37  0 - 37 U/L   ALT 56 (*) 0 - 53 U/L   Alkaline  Phosphatase 82  39 - 117 U/L   Total Bilirubin 0.7  0.3 - 1.2 mg/dL   GFR calc non Af Amer >90  >90 mL/min   GFR calc Af Amer >90  >90 mL/min   Comment: (NOTE)     The eGFR has been calculated using the CKD EPI equation.     This calculation has not been validated in all clinical situations.     eGFR's persistently <90 mL/min signify possible Chronic Kidney     Disease.   Ct Abdomen Pelvis W Contrast  07/02/2013   CLINICAL DATA:  Right lower quadrant abdominal pain  EXAM: CT ABDOMEN AND PELVIS WITH CONTRAST  TECHNIQUE: Multidetector CT imaging of the abdomen and pelvis was performed using the standard  protocol following bolus administration of intravenous contrast.  CONTRAST:  OMNIPAQUE IOHEXOL 300 MG/ML  SOLN  COMPARISON:  02/07/2010  FINDINGS: Lung bases demonstrate a tiny 7 mm subpleural nodule anteriorly in the right middle lobe, image 2. Minor dependent basilar atelectasis. No lower lobe pneumonia. Normal heart size. No pericardial or pleural effusion.  Abdomen: Diffuse hypoattenuation of the liver parenchyma compatible with hepatic steatosis or fatty infiltration. No biliary dilatation. Hepatic and portal veins are patent. Right hepatic lobe demonstrates a focal enhancing lesion measuring 17 x 15 mm. This is stable in size. Enhancement characteristics are compatible with a small hemangioma as described on previous reports. No other hepatic abnormality.  Gallbladder, biliary system, pancreas, spleen, adrenal glands, and kidneys are within normal limits for age and demonstrate no acute process.  Kidneys demonstrate incidental cortical cysts, largest in the right kidney midpole laterally measures 17 mm. No renal obstruction or hydronephrosis. No ureteral calculus.  In the right lower quadrant, there is an appendicolith on image 62 at the appendix orifice. Appendix is thickened with surrounding inflammation, images 62-67. Findings compatible with acute nonruptured appendicitis. The cecum and  terminal ileum are unremarkable. No fluid collection, abscess, or obstruction.  Mild atherosclerosis of the aorta. Negative for aneurysm.  Pelvis: No pelvic free fluid, fluid collection, hemorrhage, abscess, adenopathy, inguinal abnormality, or hernia. Urinary bladder unremarkable. No acute distal bowel process.  Minor degenerative changes of the spine. 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 There is associated degenerative change but no anterior slippage. Chronic degenerative changes at T11-12 as before of lower thoracic spine.  IMPRESSION: Acute nonruptured appendicitis with an associated appendicolith.  Negative for an abscess, obstruction, or fluid collection  Inferior right middle lobe 7 mm nodule  If the patient is at high risk for bronchogenic carcinoma, follow-up chest CT at 3-24months is recommended. If the patient is at low risk for bronchogenic carcinoma, follow-up chest CT at 6-12 months is recommended. This recommendation follows the consensus statement: Guidelines for Management of Small Pulmonary Nodules Detected on CT Scans: A Statement from the Fleischner Society as published in Radiology 2005; 237:395-400.  Critical Value/emergent results were called by telephone at the time of interpretation on 07/02/2013 at 4:29 PM to Dr.JOHN [facility] , who verbally acknowledged these results.   Electronically Signed   By: Ruel Favors M.D.   On: 07/02/2013 16:33    Review of Systems  Constitutional: Positive for malaise/fatigue.  HENT: Negative.   Eyes: Negative.   Respiratory: Negative.   Cardiovascular: Negative.   Gastrointestinal: Positive for nausea and abdominal pain. Negative for vomiting.  Genitourinary: Negative.   Skin: Negative.   All other systems reviewed and are negative.    Blood pressure 165/91, pulse 113, temperature 99 F (37.2 C), temperature source Oral, resp. rate 20, SpO2 98.00%. Physical Exam  Constitutional: He is oriented to person, place, and time. He  appears well-developed and well-nourished.  HENT:  Head: Normocephalic and atraumatic.  Neck: Normal range of motion. Neck supple.  Cardiovascular: Normal rate, regular rhythm and normal heart sounds.   Respiratory: Effort normal and breath sounds normal.  GI: Soft. He exhibits no distension. There is tenderness. There is no rebound.  Tender in the right lower quadrant to deep palpation. No rigidity noted.  Neurological: He is alert and oriented to person, place, and time.  Skin: Skin is warm and dry.     Assessment/Plan IMPRESSION: ACUTE APPENDICITIS   plan: The patient will be taken to the operating room for  laparoscopic appendectomy. The risks and benefits of the procedure including bleeding, infection, and the possibility of an open procedure were fully explained to the patient, gave informed consent.  Trelon Plush A 07/02/2013, 6:25 PM

## 2013-07-02 NOTE — ED Notes (Signed)
Abdominal pain

## 2013-07-02 NOTE — Anesthesia Postprocedure Evaluation (Addendum)
  Anesthesia Post-op Note  Patient: Justin Webster  Procedure(s) Performed: Procedure(s): APPENDECTOMY LAPAROSCOPIC (N/A)  Patient Location: PACU  Anesthesia Type:General  Level of Consciousness: awake, alert  and oriented  Airway and Oxygen Therapy: Patient Spontanous Breathing and Patient connected to face mask oxygen  Post-op Pain: mild  Post-op Assessment: Post-op Vital signs reviewed, Patient's Cardiovascular Status Stable, Respiratory Function Stable, Patent Airway and No signs of Nausea or vomiting  Post-op Vital Signs: Reviewed  Problems with monitor, pt alert, resp easy and regular, pink.  Mild pain.  Complications: No apparent anesthesia complications

## 2013-07-02 NOTE — Transfer of Care (Signed)
Immediate Anesthesia Transfer of Care Note  Patient: Justin Webster  Procedure(s) Performed: Procedure(s): APPENDECTOMY LAPAROSCOPIC (N/A)  Patient Location: PACU  Anesthesia Type:General  Level of Consciousness: awake, alert  and oriented  Airway & Oxygen Therapy: Patient Spontanous Breathing and Patient connected to face mask oxygen  Post-op Assessment: Report given to PACU RN  Post vital signs: Reviewed and stable  Complications: No apparent anesthesia complications

## 2013-07-02 NOTE — Anesthesia Preprocedure Evaluation (Signed)
Anesthesia Evaluation  Patient identified by MRN, date of birth, ID band Patient awake    Reviewed: Allergy & Precautions, H&P , NPO status , Patient's Chart, lab work & pertinent test results  Airway Mallampati: II TM Distance: >3 FB Neck ROM: Full    Dental  (+) Teeth Intact   Pulmonary neg pulmonary ROS,  breath sounds clear to auscultation  Pulmonary exam normal       Cardiovascular Exercise Tolerance: Good hypertension, Rhythm:Regular Rate:Normal  Negative cardiac cath and also had a  stress test this year that was negativet    Neuro/Psych    GI/Hepatic   Endo/Other  negative endocrine ROS  Renal/GU      Musculoskeletal   Abdominal   Peds  Hematology   Anesthesia Other Findings   Reproductive/Obstetrics                           Anesthesia Physical Anesthesia Plan  ASA: II and emergent  Anesthesia Plan: General   Post-op Pain Management:    Induction: Intravenous, Rapid sequence and Cricoid pressure planned  Airway Management Planned: Oral ETT  Additional Equipment:   Intra-op Plan:   Post-operative Plan: Extubation in OR  Informed Consent: I have reviewed the patients History and Physical, chart, labs and discussed the procedure including the risks, benefits and alternatives for the proposed anesthesia with the patient or authorized representative who has indicated his/her understanding and acceptance.     Plan Discussed with: Anesthesiologist  Anesthesia Plan Comments:         Anesthesia Quick Evaluation

## 2013-07-02 NOTE — Anesthesia Procedure Notes (Signed)
Procedure Name: Intubation Date/Time: 07/02/2013 7:22 PM Performed by: Glynn Octave E Pre-anesthesia Checklist: Patient identified, Patient being monitored, Timeout performed, Emergency Drugs available and Suction available Patient Re-evaluated:Patient Re-evaluated prior to inductionOxygen Delivery Method: Circle System Utilized Preoxygenation: Pre-oxygenation with 100% oxygen Intubation Type: IV induction, Rapid sequence and Cricoid Pressure applied Ventilation: Mask ventilation without difficulty Laryngoscope Size: Mac and 3 Grade View: Grade I Tube type: Oral Tube size: 7.0 mm Number of attempts: 1 Airway Equipment and Method: stylet Placement Confirmation: ETT inserted through vocal cords under direct vision,  positive ETCO2 and breath sounds checked- equal and bilateral Secured at: 21 cm Tube secured with: Tape Dental Injury: Teeth and Oropharynx as per pre-operative assessment

## 2013-07-02 NOTE — ED Provider Notes (Signed)
CSN: [id number]     Arrival date & time 07/02/13  1706 History  This chart was scribed for Benny Lennert, MD by Bennett Scrape, ED Scribe. This patient was seen in room APA17/APA17 and the patient's care was started at 5:12 PM.   Chief Complaint  Patient presents with  . Abdominal Pain    Patient is a 42 y.o. male presenting with abdominal pain. The history is provided by the patient. No language interpreter was used.  Abdominal Pain Pain location:  RLQ Pain radiates to:  Does not radiate Pain severity:  Moderate Onset quality:  Sudden Duration:  17 hours Timing:  Constant Progression:  Worsening Chronicity:  New Relieved by:  Nothing Worsened by:  Nothing tried Ineffective treatments:  None tried Associated symptoms: no chest pain, no cough, no diarrhea, no fatigue and no hematuria     HPI Comments: Justin Webster is a 42 y.o. male who presents to the Emergency Department complaining of sudden onset, non-radiating RLQ abdominal pain that started at 1 AM this morning. Pt was seen by PCP for the same and was sent here for further examination when the CT of the abdomen showed appendicitis. Pt denies any fevers or emesis. He denies any prior abdominal surgeries. He last ate around 8 PM last night.   PCP is Dr. Phillips Odor  No past medical history on file.- HLD, HTN treated with medications per pt at bedside.  No past surgical history on file. No family history on file. History  Substance Use Topics  . Smoking status: Not on file  . Smokeless tobacco: Not on file  . Alcohol Use: Not on file    Review of Systems  Constitutional: Negative for appetite change and fatigue.  HENT: Negative for congestion, ear discharge and sinus pressure.   Eyes: Negative for discharge.  Respiratory: Negative for cough.   Cardiovascular: Negative for chest pain.  Gastrointestinal: Positive for abdominal pain. Negative for diarrhea.  Genitourinary: Negative for frequency and hematuria.   Musculoskeletal: Negative for back pain.  Skin: Negative for rash.  Neurological: Negative for seizures and headaches.  Psychiatric/Behavioral: Negative for hallucinations.    Allergies  Review of patient's allergies indicates no known allergies.  Home Medications  No current outpatient prescriptions on file.  Triage Vitals: BP 165/91  Pulse 113  Temp(Src) 99 F (37.2 C) (Oral)  Resp 20  SpO2 98%  Physical Exam  Nursing note and vitals reviewed. Constitutional: He is oriented to person, place, and time. He appears well-developed and well-nourished.  HENT:  Head: Normocephalic and atraumatic.  Eyes: Conjunctivae and EOM are normal. No scleral icterus.  Neck: Neck supple. No thyromegaly present.  Cardiovascular: Normal rate and regular rhythm.  Exam reveals no gallop and no friction rub.   No murmur heard. Pulmonary/Chest: Effort normal. No stridor. He has no wheezes. He has no rales. He exhibits no tenderness.  Abdominal: Soft. He exhibits no distension. There is tenderness (moderate RLQ tenderness). There is no rebound.  Musculoskeletal: Normal range of motion. He exhibits no edema.  Lymphadenopathy:    He has no cervical adenopathy.  Neurological: He is alert and oriented to person, place, and time. He exhibits normal muscle tone. Coordination normal.  Skin: Skin is warm and dry. No rash noted. No erythema.  Psychiatric: He has a normal mood and affect. His behavior is normal.    ED Course  Procedures (including critical care time)  DIAGNOSTIC STUDIES: Oxygen Saturation is 98% on room air, normal by my interpretation.  COORDINATION OF CARE: 5:16 PM-Informed pt of CT of abdomen showing appendicitis. Discussed treatment plan which includes consult to surgery with pt at bedside and pt agreed to plan. Advised pt to not eat or drink anything further.   5:25 PM-Consult complete with Dr. Lovell Sheehan, surgery. Patient case explained and discussed. Dr. Lovell Sheehan agrees to admit  patient for further evaluation and treatment. Call ended at 5:27 PM.  Labs Review Labs Reviewed  CBC WITH DIFFERENTIAL  COMPREHENSIVE METABOLIC PANEL   Imaging Review Ct Abdomen Pelvis W Contrast  07/02/2013   CLINICAL DATA:  Right lower quadrant abdominal pain  EXAM: CT ABDOMEN AND PELVIS WITH CONTRAST  TECHNIQUE: Multidetector CT imaging of the abdomen and pelvis was performed using the standard protocol following bolus administration of intravenous contrast.  CONTRAST:  OMNIPAQUE IOHEXOL 300 MG/ML  SOLN  COMPARISON:  02/07/2010  FINDINGS: Lung bases demonstrate a tiny 7 mm subpleural nodule anteriorly in the right middle lobe, image 2. Minor dependent basilar atelectasis. No lower lobe pneumonia. Normal heart size. No pericardial or pleural effusion.  Abdomen: Diffuse hypoattenuation of the liver parenchyma compatible with hepatic steatosis or fatty infiltration. No biliary dilatation. Hepatic and portal veins are patent. Right hepatic lobe demonstrates a focal enhancing lesion measuring 17 x 15 mm. This is stable in size. Enhancement characteristics are compatible with a small hemangioma as described on previous reports. No other hepatic abnormality.  Gallbladder, biliary system, pancreas, spleen, adrenal glands, and kidneys are within normal limits for age and demonstrate no acute process.  Kidneys demonstrate incidental cortical cysts, largest in the right kidney midpole laterally measures 17 mm. No renal obstruction or hydronephrosis. No ureteral calculus.  In the right lower quadrant, there is an appendicolith on image 62 at the appendix orifice. Appendix is thickened with surrounding inflammation, images 62-67. Findings compatible with acute nonruptured appendicitis. The cecum and terminal ileum are unremarkable. No fluid collection, abscess, or obstruction.  Mild atherosclerosis of the aorta. Negative for aneurysm.  Pelvis: No pelvic free fluid, fluid collection, hemorrhage, abscess,  adenopathy, inguinal abnormality, or hernia. Urinary bladder unremarkable. No acute distal bowel process.  Minor degenerative changes of the spine. 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 There is associated degenerative change but no anterior slippage. Chronic degenerative changes at T11-12 as before of lower thoracic spine.  IMPRESSION: Acute nonruptured appendicitis with an associated appendicolith.  Negative for an abscess, obstruction, or fluid collection  Inferior right middle lobe 7 mm nodule  If the patient is at high risk for bronchogenic carcinoma, follow-up chest CT at 3-22months is recommended. If the patient is at low risk for bronchogenic carcinoma, follow-up chest CT at 6-12 months is recommended. This recommendation follows the consensus statement: Guidelines for Management of Small Pulmonary Nodules Detected on CT Scans: A Statement from the Fleischner Society as published in Radiology 2005; 237:395-400.  Critical Value/emergent results were called by telephone at the time of interpretation on 07/02/2013 at 4:29 PM to Dr.JOHN [facility] , who verbally acknowledged these results.   Electronically Signed   By: Ruel Favors M.D.   On: 07/02/2013 16:33    EKG Interpretation   None       MDM  No diagnosis found. The chart was scribed for me under my direct supervision.  I personally performed the history, physical, and medical decision making and all procedures in the evaluation of this patient.Benny Lennert, MD 07/02/13 [id number]

## 2013-07-02 NOTE — Op Note (Signed)
Patient:  Justin Webster  DOB:  15-Jun-1971  MRN:  [id number]   Preop Diagnosis:  Acute appendicitis  Postop Diagnosis:  Same  Procedure:  Laparoscopic appendectomy  Surgeon:  Franky Macho, M.D.  Anes:  General endotracheal  Indications:   Patient is a 42 year old white male who presents with right lower quadrant abdominal pain which has been occurring for less than 12 hours. Outpatient CT scan the abdomen reveals acute appendicitis. The patient now comes to the operating room for laparoscopic appendectomy. The risks and benefits of the procedure including bleeding, infection, and the possibility of an open procedure were fully explained to the patient, who gave informed consent.  Procedure note:  The patient was placed the supine position. After induction of general endotracheal anesthesia, the abdomen was prepped and draped using usual sterile technique with DuraPrep. Surgical site confirmation was performed.  A supraumbilical incision was made down to the fascia. A Veress needle was introduced into the abdominal cavity and confirmation of placement was done using the saline drop test. The abdomen was then insufflated to 16 mm mercury pressure. An 11 mm trocar was introduced into the abdominal cavity under direct visualization without difficulty. The patient was placed in deeper Trendelenburg position and additional 12 mm trocar was placed the suprapubic region and 5 mm trocar was placed left lower quadrant region. The appendix was visualized and noted to be acutely inflamed. No evidence of perforation was noted. The mesoappendix was divided using the harmonic scalpel. The normal-appearing base the appendix was then transected using a standard Endo GIA. The appendix was then removed from the operative field using an Endo Catch bag and sent to pathology for further examination. The staple line was inspected and noted within normal limits. All fluid and air were then evacuated from the right lower  corner of the abdomen prior to removal of the trochars.  All wounds were irrigated normal saline. All wounds were checked with 0.5% Sensorcaine. The supraumbilical fascia was reapproximated using 0 Vicryl interrupted suture. All skin incisions were closed using staples. Betadine ointment and dry sterile dressings were applied.  All tape and needle counts were correct at the end of the procedure. Patient was extubated in the operating room and transferred to PACU in stable condition.  Complications:  None  EBL:  Minimal  Specimen:  Appendix

## 2013-07-03 LAB — BASIC METABOLIC PANEL
BUN: 8 mg/dL (ref 6–23)
Calcium: 8.8 mg/dL (ref 8.4–10.5)
Creatinine, Ser: 0.95 mg/dL (ref 0.50–1.35)
GFR calc Af Amer: >90 mL/min (ref >90)
GFR calc non Af Amer: >90 mL/min (ref >90)
Glucose, Bld: 107 mg/dL — ABNORMAL HIGH (ref 70–99)
Potassium: 3.5 mEq/L (ref 3.5–5.1)

## 2013-07-03 LAB — CBC
HCT: 36.9 % — ABNORMAL LOW (ref 39.0–52.0)
Hemoglobin: 12.5 g/dL — ABNORMAL LOW (ref 13.0–17.0)
MCH: 31.9 pg (ref 26.0–34.0)
MCHC: 33.9 g/dL (ref 30.0–36.0)
MCV: 94.1 fL (ref 78.0–100.0)
RDW: 13.9 % (ref 11.5–15.5)
WBC: 13.2 10*3/uL — ABNORMAL HIGH (ref 4.0–10.5)

## 2013-07-03 MED ORDER — OXYCODONE-ACETAMINOPHEN 7.5-325 MG PO TABS: 1.0000 | ORAL_TABLET | ORAL | Status: DC | PRN

## 2013-07-03 MED ORDER — AMOXICILLIN-POT CLAVULANATE 875-125 MG PO TABS: 1.0000 | ORAL_TABLET | Freq: Two times a day (BID) | ORAL | Status: DC

## 2013-07-03 NOTE — Discharge Planning (Signed)
Pt stated that he was ready tot go home and pain was under control.  Pt's IV removed and DC paper given while family in room.  Pt educated on S/sx that would cause him to call doctor or return to hospital.  Pt will be wheeled to car when ready by staff.  Pt was also given scripts.

## 2013-07-03 NOTE — Progress Notes (Signed)
Patient arrived to floor in no acute distress. Dressings were dry and intact. Family was present. Will continue to monitor.

## 2013-07-03 NOTE — Progress Notes (Signed)
UR chart review completed.  

## 2013-07-03 NOTE — Discharge Summary (Signed)
Physician Discharge Summary  Patient ID: Justin Webster MRN: [id number] DOB/AGE: 12-Nov-1970 42 y.o.  Admit date: 07/02/2013 Discharge date: 07/03/2013  Admission Diagnoses: Acute appendicitis Discharge Diagnoses: Same Active Problems:   * No active hospital problems. *   Discharged Condition: good  Hospital Course: Patient is a 42 year old white male who presented emergency room with worsening right lower quadrant abdominal pain. He had had a CT scan of the abdomen as an outpatient was found to have acute appendicitis. He was taken to the operating room and underwent laparoscopic appendectomy. Tolerated the procedure well. Postoperative course has been unremarkable. His diet was advanced without difficulty. His leukocytosis is resolving. He is being discharged home in good improving condition.  Treatments: surgery: Laparoscopic appendectomy on 07/02/2013  Discharge Exam: Blood pressure 126/72, pulse 66, temperature 98.2 F (36.8 C), temperature source Oral, resp. rate 20, height 6' (1.829 m), weight 123.514 kg (272 lb 4.8 oz), SpO2 97.00%. General appearance: alert, cooperative and no distress Resp: clear to auscultation bilaterally Cardio: regular rate and rhythm, S1, S2 normal, no murmur, click, rub or gallop GI: Soft. Dressings dry and intact.  Disposition:  home     Medication List         ALPRAZolam 1 MG tablet  Commonly known as:  XANAX  Take 0.5-1 mg by mouth every morning.     amLODipine 5 MG tablet  Commonly known as:  NORVASC  Take 5 mg by mouth at bedtime.     amoxicillin-clavulanate 875-125 MG per tablet  Commonly known as:  AUGMENTIN  Take 1 tablet by mouth 2 (two) times daily.     metoprolol succinate 25 MG 24 hr tablet  Commonly known as:  TOPROL-XL  Take 25 mg by mouth at bedtime.     nabumetone 500 MG tablet  Commonly known as:  RELAFEN  Take 500 mg by mouth every morning.     niacin 500 MG CR capsule  Take 500 mg by mouth at bedtime.     oxyCODONE-acetaminophen 7.5-325 MG per tablet  Commonly known as:  PERCOCET  Take 1-2 tablets by mouth every 4 (four) hours as needed.     pantoprazole 40 MG tablet  Commonly known as:  PROTONIX  Take 40 mg by mouth every morning.     rosuvastatin 20 MG tablet  Commonly known as:  CRESTOR  Take 30 mg by mouth at bedtime.           Follow-up Information   Follow up with Dalia Heading, MD. Schedule an appointment as soon as possible for a visit on 07/09/2013.   Specialty:  General Surgery   Contact information:   [address] [place] [address] [phone]       Signed: Franky Macho A 07/03/2013, 9:29 AM

## 2013-07-07 ENCOUNTER — Encounter (HOSPITAL_COMMUNITY): Payer: Self-pay | Admitting: General Surgery

## 2013-07-16 ENCOUNTER — Other Ambulatory Visit: Payer: Self-pay

## 2014-06-25 ENCOUNTER — Other Ambulatory Visit: Payer: Self-pay

## 2014-12-20 ENCOUNTER — Other Ambulatory Visit (HOSPITAL_COMMUNITY): Payer: Self-pay | Admitting: Family Medicine

## 2014-12-20 DIAGNOSIS — R911 Solitary pulmonary nodule: Secondary | ICD-10-CM

## 2014-12-22 ENCOUNTER — Ambulatory Visit (HOSPITAL_COMMUNITY): Payer: BLUE CROSS/BLUE SHIELD

## 2014-12-24 ENCOUNTER — Ambulatory Visit (HOSPITAL_COMMUNITY)
Admission: RE | Admit: 2014-12-24 | Discharge: 2014-12-24 | Disposition: A | Discharge status: Home / Self Care | Payer: BLUE CROSS/BLUE SHIELD | Source: Ambulatory Visit | Attending: Family Medicine | Admitting: Family Medicine

## 2014-12-24 DIAGNOSIS — R911 Solitary pulmonary nodule: Secondary | ICD-10-CM

## 2014-12-24 MED ORDER — IOHEXOL 300 MG/ML  SOLN
80.0000 mL | Freq: Once | INTRAMUSCULAR | Status: AC | PRN
  Administered 2014-12-24: 80 mL via INTRAVENOUS

## 2015-03-31 ENCOUNTER — Encounter: Payer: Self-pay | Admitting: *Deleted

## 2015-05-05 ENCOUNTER — Encounter: Payer: Self-pay | Admitting: Cardiovascular Disease

## 2016-01-06 DIAGNOSIS — E782 Mixed hyperlipidemia: Secondary | ICD-10-CM | POA: Diagnosis not present

## 2016-01-06 DIAGNOSIS — I10 Essential (primary) hypertension: Secondary | ICD-10-CM | POA: Diagnosis not present

## 2016-01-06 DIAGNOSIS — E6609 Other obesity due to excess calories: Secondary | ICD-10-CM | POA: Diagnosis not present

## 2016-01-06 DIAGNOSIS — Z1389 Encounter for screening for other disorder: Secondary | ICD-10-CM | POA: Diagnosis not present

## 2016-01-06 DIAGNOSIS — R7309 Other abnormal glucose: Secondary | ICD-10-CM | POA: Diagnosis not present

## 2016-01-06 DIAGNOSIS — Z6836 Body mass index (BMI) 36.0-36.9, adult: Secondary | ICD-10-CM | POA: Diagnosis not present

## 2016-01-23 ENCOUNTER — Telehealth: Payer: Self-pay | Admitting: Cardiovascular Disease

## 2016-01-25 NOTE — Telephone Encounter (Signed)
Close encounted

## 2016-01-30 ENCOUNTER — Telehealth: Payer: Self-pay | Admitting: Cardiovascular Disease

## 2016-01-30 NOTE — Telephone Encounter (Signed)
[facility] 05/11/16 with Dr Tresa EndoKelly.  [facility] Dr Landry DykeKelly's schedule on 05/11/16. lp

## 2016-05-11 ENCOUNTER — Ambulatory Visit: Payer: BLUE CROSS/BLUE SHIELD | Admitting: Cardiovascular Disease

## 2016-05-11 ENCOUNTER — Ambulatory Visit (INDEPENDENT_AMBULATORY_CARE_PROVIDER_SITE_OTHER): Payer: BLUE CROSS/BLUE SHIELD | Admitting: Cardiovascular Disease

## 2016-05-11 ENCOUNTER — Encounter: Payer: Self-pay | Admitting: Cardiovascular Disease

## 2016-05-11 VITALS — BP 138/82 | HR 66 | Ht 72.0 in | Wt 263.5 lb

## 2016-05-11 DIAGNOSIS — E785 Hyperlipidemia, unspecified: Secondary | ICD-10-CM | POA: Diagnosis not present

## 2016-05-11 DIAGNOSIS — Z79899 Other long term (current) drug therapy: Secondary | ICD-10-CM

## 2016-05-11 DIAGNOSIS — R0683 Snoring: Secondary | ICD-10-CM

## 2016-05-11 DIAGNOSIS — R0681 Apnea, not elsewhere classified: Secondary | ICD-10-CM

## 2016-05-11 DIAGNOSIS — I10 Essential (primary) hypertension: Secondary | ICD-10-CM

## 2016-05-11 NOTE — Patient Instructions (Signed)
Your physician has recommended that you have a sleep study. This test records several body functions during sleep, including: brain activity, eye movement, oxygen and carbon dioxide blood levels, heart rate and rhythm, breathing rate and rhythm, the flow of air through your mouth and nose, snoring, body muscle movements, and chest and belly movement. This will be done at [facility].  Your physician recommends that you return for lab work fasting.  Your physician recommends that you schedule a follow-up appointment in: 3 months with Dr Tresa EndoKelly.

## 2016-05-11 NOTE — Progress Notes (Signed)
138/82

## 2016-05-13 NOTE — Progress Notes (Signed)
Primary MD: Dr. Hilma Favors  PATIENT PROFILE: Justin Webster is a 45 y.o. male who is referred through the courtesy of Dr. Hilma Favors for cardiology evaluation.   HPI:  Justin Webster has remote history of chest pain and was hospitalized in 2004.  At that time, he had negative d-dimer and negative cardiac enzymes and underwent cardiac catheterization by Dr. Lia Foyer.  This revealed normal coronary arteries.  The patient subtotally saw Dr. Rollene Fare and last saw him in May 2014.  At that time, the patient had had several friends who had died of myocardial infarctions in their 31s.  The patient was experiencing several episodes of short-lived precordial chest discomfort that was nonexertional.  A nuclear stress test on 01/30/2013 was normal.  Patient has a history of hypertension for 20 years, hyperlipidemia, and some anxiety.  He has had difficulty control cholesterol elevations and recently has been on rosuvastatin 20 mg, niacin 500 mg, fenofibrate 145 mg, and fish oil 1200 mg capsule.  He has been taking metoprolol 25 mg daily.  He has a history of GERD for which he takes Bentyl prior's all.  He had undergone blood work by Dr. Hilma Favors in April 2017.  I reviewed this.  Lipid studies were notable for total cholesterol 191, triglycerides 404, HDL 19, and he also had mild transaminase elevation with an AST of 54 and an ALT of 52.  He now presents for further evaluation.  Upon further questioning, the patient states that his sleep is very poor.  He has had witnessed apnea, and according to his wife he stops breathing when he sleeps.  He does snore.  Typically he goes to bed around 9:30 PM and wakes up at 5 AM.  He is nonrestorative.  Past Medical History:  Diagnosis Date  . Fatigue   . H/O chest pain   . HLD (hyperlipidemia)   . Hypertension   . Obesity   . Panic disorder   . Peptic ulcer   . Stress     Past Surgical History:  Procedure Laterality Date  . APPENDECTOMY    . LAPAROSCOPIC  APPENDECTOMY N/A 07/02/2013   Procedure: APPENDECTOMY LAPAROSCOPIC;  Surgeon: Jamesetta So, MD;  Location: [facility];  Service: General;  Laterality: N/A;    No Known Allergies  Current Outpatient Prescriptions  Medication Sig Dispense Refill  . ALPRAZolam (XANAX) 1 MG tablet Take 0.5-1 mg by mouth every morning.    Marland Kitchen amLODipine (NORVASC) 5 MG tablet Take 5 mg by mouth at bedtime.    . fenofibrate (TRICOR) 145 MG tablet Take 145 mg by mouth daily.    . metoprolol succinate (TOPROL-XL) 25 MG 24 hr tablet Take 25 mg by mouth at bedtime.    . Multiple Vitamin (MULTIVITAMIN WITH MINERALS) TABS tablet Take 1 tablet by mouth daily.    . niacin 500 MG CR capsule Take 500 mg by mouth at bedtime.    . Omega-3 Fatty Acids (FISH OIL) 1200 MG CAPS Take 1 capsule by mouth daily.    . pantoprazole (PROTONIX) 40 MG tablet Take 40 mg by mouth every morning.    . Probiotic Product (PROBIOTIC PO) Take 1 tablet by mouth daily.    . rosuvastatin (CRESTOR) 20 MG tablet Take 30 mg by mouth at bedtime.     No current facility-administered medications for this visit.     Social History   Social History  . Marital status: Married    Spouse name: N/A  . Number of  children: N/A  . Years of education: N/A   Occupational History  . Not on file.   Social History Main Topics  . Smoking status: Former Research scientist (life sciences)  . Smokeless tobacco: Not on file  . Alcohol use Not on file  . Drug use: Unknown  . Sexual activity: Not on file   Other Topics Concern  . Not on file   Social History Narrative  . No narrative on file    Family History  Problem Relation Age of Onset  . Sudden death Father   . Heart Problems Paternal Grandmother   . Heart Problems Paternal Grandfather     ROS General: Negative; No fevers, chills, or night sweats; Positive for obesity HEENT: Negative; No changes in vision or hearing, sinus congestion, difficulty swallowing Pulmonary: Negative; No cough, wheezing, shortness of breath,  hemoptysis Cardiovascular:  See HPI; No stent chest pain GI: Negative; No nausea, vomiting, diarrhea, or abdominal pain GU: Negative; No dysuria, hematuria, or difficulty voiding Musculoskeletal: Negative; no myalgias, joint pain, or weakness Hematologic/Oncologic: Negative; no easy bruising, bleeding Endocrine: Negative; no heat/cold intolerance; no diabetes Neuro: Negative; no changes in balance, headaches Skin: Negative; No rashes or skin lesions Psychiatric: Positive for anxiety Sleep: Negative; No daytime sleepiness, hypersomnolence, bruxism, restless legs, hypnogagnic hallucinations Other comprehensive 14 point system review is negative   Physical Exam BP 138/82 (BP Location: Left Arm, Patient Position: Sitting, Cuff Size: Large)   Pulse 66   Ht 6' (1.829 m)   Wt 263 lb 8 oz (119.5 kg)   BMI 35.74 kg/m   Wt Readings from Last 3 Encounters:  05/11/16 263 lb 8 oz (119.5 kg)  07/02/13 272 lb 4.8 oz (123.5 kg)  01/30/13 257 lb (116.6 kg)   General: Alert, oriented, no distress.  Skin: normal turgor, no rashes, warm and dry HEENT: Normocephalic, atraumatic. Pupils equal round and reactive to light; sclera anicteric; extraocular muscles intact; Fundi Without hemorrhages or exudates Nose without nasal septal hypertrophy Mouth/Parynx benign; Mallinpatti scale Neck: No JVD, no carotid bruits; normal carotid upstroke Lungs: clear to ausculatation and percussion; no wheezing or rales Chest Czerwinski: without tenderness to palpitation Heart: PMI not displaced, RRR, s1 s2 normal, 1/6 systolic murmur, no diastolic murmur, no rubs, gallops, thrills, or heaves Abdomen: soft, nontender; no hepatosplenomehaly, BS+; abdominal aorta nontender and not dilated by palpation. Back: no CVA tenderness Pulses 2+ Musculoskeletal: full range of motion, normal strength, no joint deformities Extremities: no clubbing cyanosis or edema, Homan's sign negative  Neurologic: grossly nonfocal; Cranial nerves  grossly wnl Psychologic: Normal mood and affect   ECG (independently read by me): Normal sinus rhythm at 66 bpm.  Incomplete right bundle branch block.  Normal intervals.  LABS:  BMP Latest Ref Rng & Units 07/03/2013 07/02/2013 01/23/2013  Glucose 70 - 99 mg/dL 107(H) 115(H) 107(H)  BUN 6 - 23 mg/dL 8 7 12   Creatinine 0.50 - 1.35 mg/dL 0.95 0.81 0.88  Sodium 135 - 145 mEq/L 138 134(L) 137  Potassium 3.5 - 5.1 mEq/L 3.5 3.5 4.1  Chloride 96 - 112 mEq/L 102 96 103  CO2 19 - 32 mEq/L 26 24 24   Calcium 8.4 - 10.5 mg/dL 8.8 9.3 9.3     Hepatic Function Latest Ref Rng & Units 07/02/2013 01/23/2013  Total Protein 6.0 - 8.3 g/dL 7.7 7.0  Albumin 3.5 - 5.2 g/dL 3.9 4.4  AST 0 - 37 U/L 37 53(H)  ALT 0 - 53 U/L 56(H) 77(H)  Alk Phosphatase 39 - 117 U/L 82  84  Total Bilirubin 0.3 - 1.2 mg/dL 0.7 0.7    CBC Latest Ref Rng & Units 07/03/2013 07/02/2013  WBC 4.0 - 10.5 K/uL 13.2(H) 17.1(H)  Hemoglobin 13.0 - 17.0 g/dL 12.5(L) 14.3  Hematocrit 39.0 - 52.0 % 36.9(L) 40.9  Platelets 150 - 400 K/uL 176 186   Lab Results  Component Value Date   MCV 94.1 07/03/2013   MCV 92.5 07/02/2013   No results found for: TSH No results found for: HGBA1C   BNP No results found for: BNP  ProBNP No results found for: PROBNP   Lipid Panel     Component Value Date/Time   CHOL 201 (H) 04/17/2013 1009   TRIG 687 (H) 04/17/2013 1009   HDL 40 04/17/2013 1009   CHOLHDL 5.0 04/17/2013 1009   VLDL NOT CALC 04/17/2013 1009   Wilcox  04/17/2013 1009     Comment:       Not calculated due to Triglyceride >400. Suggest ordering Direct LDL (Unit Code: 8320499673).   Total Cholesterol/HDL Ratio:CHD Risk                        Coronary Heart Disease Risk Table                                        Men       Women          1/2 Average Risk              3.4        3.3              Average Risk              5.0        4.4           2X Average Risk              9.6        7.1           3X Average Risk              23.4       11.0 Use the calculated Patient Ratio above and the CHD Risk table  to determine the patient's CHD Risk. ATP III Classification (LDL):       < 100        mg/dL         Optimal      100 - 129     mg/dL         Near or Above Optimal      130 - 159     mg/dL         Borderline High      160 - 189     mg/dL         High       > 190        mg/dL         Very High     LDLDIRECT 76 04/17/2013 1009    RADIOLOGY: No results found.   ASSESSMENT AND PLAN: Justin Webster is a 45 year old gentleman who has at least a 20 year history of hypertension, a history of obesity, and has significant history of mixed hyperlipidemia.  His blood pressure today is controlled on amlodipine as well as low-dose Toprol-XL 25 mg  daily.  Review of prior records indicates significant issues with marked hypertriglyceridemia and in August 2014, his triglyceride level was 687 with a total cholesterol 201, HDL 40 and LDL was not able to be calculated.  He now has been on a 4 drug regimen consisting of Crestor 20 mg, fenofibrate 145 mg, over-the-counter fish oil, in addition to low-dose niacin.  I have recommended follow-up blood work be obtained on his current regimen.  We discussed the importance of dietary adjustment with reference to carbohydrates and sugar/sweets intake.  We discussed the importance of weight loss.  I am also concerned that he has a symptom complex, highly suggestive of obstructive sleep apnea 20 also be contributory to his hypertension and metabolic abnormalities.  For this reason, I am scheduling him for a split-night sleep study for further evaluation.  I reviewed records from Dr. Hilma Favors.  Although he is not overtly diabetic by clinical history he undoubtedly has metabolic syndrome.  I suspect his prior transaminase elevations are due to hepatostearrhea from his hypertriglyceridemia.  I will contact him regarding his blood work results.  I will see him in 2-3 months for follow-up  evaluation.   Troy Sine, MD, [facility] 05/13/2016 5:35 PM

## 2016-05-15 ENCOUNTER — Telehealth: Payer: Self-pay | Admitting: Cardiovascular Disease

## 2016-05-15 NOTE — Telephone Encounter (Signed)
The patient will call and schedule his own sleep study.

## 2016-06-06 DIAGNOSIS — J209 Acute bronchitis, unspecified: Secondary | ICD-10-CM | POA: Diagnosis not present

## 2016-06-06 DIAGNOSIS — J069 Acute upper respiratory infection, unspecified: Secondary | ICD-10-CM | POA: Diagnosis not present

## 2016-06-06 DIAGNOSIS — Z1389 Encounter for screening for other disorder: Secondary | ICD-10-CM | POA: Diagnosis not present

## 2016-06-06 DIAGNOSIS — Z6837 Body mass index (BMI) 37.0-37.9, adult: Secondary | ICD-10-CM | POA: Diagnosis not present

## 2016-06-11 DIAGNOSIS — J209 Acute bronchitis, unspecified: Secondary | ICD-10-CM | POA: Diagnosis not present

## 2016-06-11 DIAGNOSIS — J069 Acute upper respiratory infection, unspecified: Secondary | ICD-10-CM | POA: Diagnosis not present

## 2016-06-11 DIAGNOSIS — Z6837 Body mass index (BMI) 37.0-37.9, adult: Secondary | ICD-10-CM | POA: Diagnosis not present

## 2016-06-11 DIAGNOSIS — Z1389 Encounter for screening for other disorder: Secondary | ICD-10-CM | POA: Diagnosis not present

## 2016-07-06 DIAGNOSIS — Z1389 Encounter for screening for other disorder: Secondary | ICD-10-CM | POA: Diagnosis not present

## 2016-07-06 DIAGNOSIS — E6609 Other obesity due to excess calories: Secondary | ICD-10-CM | POA: Diagnosis not present

## 2016-07-06 DIAGNOSIS — R7309 Other abnormal glucose: Secondary | ICD-10-CM | POA: Diagnosis not present

## 2016-07-06 DIAGNOSIS — Z6836 Body mass index (BMI) 36.0-36.9, adult: Secondary | ICD-10-CM | POA: Diagnosis not present

## 2016-07-06 DIAGNOSIS — E782 Mixed hyperlipidemia: Secondary | ICD-10-CM | POA: Diagnosis not present

## 2016-07-06 DIAGNOSIS — I10 Essential (primary) hypertension: Secondary | ICD-10-CM | POA: Diagnosis not present

## 2016-08-14 ENCOUNTER — Ambulatory Visit: Payer: BLUE CROSS/BLUE SHIELD | Admitting: Cardiovascular Disease

## 2017-02-13 DIAGNOSIS — K219 Gastro-esophageal reflux disease without esophagitis: Secondary | ICD-10-CM | POA: Diagnosis not present

## 2017-02-13 DIAGNOSIS — Z1389 Encounter for screening for other disorder: Secondary | ICD-10-CM | POA: Diagnosis not present

## 2017-02-13 DIAGNOSIS — Z6838 Body mass index (BMI) 38.0-38.9, adult: Secondary | ICD-10-CM | POA: Diagnosis not present

## 2017-02-13 DIAGNOSIS — I10 Essential (primary) hypertension: Secondary | ICD-10-CM | POA: Diagnosis not present

## 2017-02-13 DIAGNOSIS — N342 Other urethritis: Secondary | ICD-10-CM | POA: Diagnosis not present

## 2017-03-22 DIAGNOSIS — E782 Mixed hyperlipidemia: Secondary | ICD-10-CM | POA: Diagnosis not present

## 2017-03-22 DIAGNOSIS — F419 Anxiety disorder, unspecified: Secondary | ICD-10-CM | POA: Diagnosis not present

## 2017-03-22 DIAGNOSIS — R7309 Other abnormal glucose: Secondary | ICD-10-CM | POA: Diagnosis not present

## 2017-03-22 DIAGNOSIS — I10 Essential (primary) hypertension: Secondary | ICD-10-CM | POA: Diagnosis not present

## 2017-03-22 DIAGNOSIS — Z1389 Encounter for screening for other disorder: Secondary | ICD-10-CM | POA: Diagnosis not present

## 2017-03-22 DIAGNOSIS — Z6838 Body mass index (BMI) 38.0-38.9, adult: Secondary | ICD-10-CM | POA: Diagnosis not present

## 2017-06-19 DIAGNOSIS — Z23 Encounter for immunization: Secondary | ICD-10-CM | POA: Diagnosis not present

## 2017-09-27 DIAGNOSIS — F419 Anxiety disorder, unspecified: Secondary | ICD-10-CM | POA: Diagnosis not present

## 2017-09-27 DIAGNOSIS — E782 Mixed hyperlipidemia: Secondary | ICD-10-CM | POA: Diagnosis not present

## 2017-09-27 DIAGNOSIS — Z6838 Body mass index (BMI) 38.0-38.9, adult: Secondary | ICD-10-CM | POA: Diagnosis not present

## 2017-09-27 DIAGNOSIS — Z1389 Encounter for screening for other disorder: Secondary | ICD-10-CM | POA: Diagnosis not present

## 2017-09-27 DIAGNOSIS — I10 Essential (primary) hypertension: Secondary | ICD-10-CM | POA: Diagnosis not present

## 2017-09-27 DIAGNOSIS — R945 Abnormal results of liver function studies: Secondary | ICD-10-CM | POA: Diagnosis not present

## 2017-09-27 DIAGNOSIS — R7309 Other abnormal glucose: Secondary | ICD-10-CM | POA: Diagnosis not present

## 2019-11-12 ENCOUNTER — Ambulatory Visit: Payer: Self-pay | Attending: Internal Medicine

## 2019-11-12 DIAGNOSIS — Z23 Encounter for immunization: Secondary | ICD-10-CM | POA: Insufficient documentation

## 2019-11-12 NOTE — Progress Notes (Signed)
   Covid-19 Vaccination Clinic  Name:  Justin Webster    MRN: [id number] DOB: 10/17/1970  11/12/2019  Mr. Justin Webster was observed post Covid-19 immunization for 15 minutes without incident. He was provided with Vaccine Information Sheet and instruction to access the V-Safe system.   Mr. Justin Webster was instructed to call 911 with any severe reactions post vaccine: Marland Kitchen Difficulty breathing  . Swelling of face and throat  . A fast heartbeat  . A bad rash all over body  . Dizziness and weakness   Immunizations Administered    Name Date Dose VIS Date Route   Moderna COVID-19 Vaccine 11/12/2019  8:57 AM 0.5 mL 08/11/2019 Intramuscular   Manufacturer: Moderna   Lot: [id number]   NDC: 58006-349-49

## 2019-12-15 ENCOUNTER — Ambulatory Visit: Payer: Self-pay | Attending: Internal Medicine

## 2019-12-15 DIAGNOSIS — Z23 Encounter for immunization: Secondary | ICD-10-CM

## 2019-12-15 NOTE — Progress Notes (Signed)
   Covid-19 Vaccination Clinic  Name:  Justin Webster    MRN: [id number] DOB: 01/31/1971  12/15/2019  Justin Webster was observed post Covid-19 immunization for 15 minutes without incident. He was provided with Vaccine Information Sheet and instruction to access the V-Safe system.   Justin Webster was instructed to call 911 with any severe reactions post vaccine: Marland Kitchen Difficulty breathing  . Swelling of face and throat  . A fast heartbeat  . A bad rash all over body  . Dizziness and weakness   Immunizations Administered    Name Date Dose VIS Date Route   Moderna COVID-19 Vaccine 12/15/2019  8:13 AM 0.5 mL 08/11/2019 Intramuscular   Manufacturer: Moderna   Lot: [id number]   NDC: 12197-588-32

## 2021-03-23 DIAGNOSIS — Z0000 Encounter for general adult medical examination without abnormal findings: Secondary | ICD-10-CM | POA: Diagnosis not present

## 2021-03-23 DIAGNOSIS — Z125 Encounter for screening for malignant neoplasm of prostate: Secondary | ICD-10-CM | POA: Diagnosis not present

## 2021-03-23 DIAGNOSIS — I10 Essential (primary) hypertension: Secondary | ICD-10-CM | POA: Diagnosis not present

## 2021-03-23 DIAGNOSIS — E782 Mixed hyperlipidemia: Secondary | ICD-10-CM | POA: Diagnosis not present

## 2021-03-24 DIAGNOSIS — R7309 Other abnormal glucose: Secondary | ICD-10-CM | POA: Diagnosis not present

## 2021-03-24 DIAGNOSIS — Z6839 Body mass index (BMI) 39.0-39.9, adult: Secondary | ICD-10-CM | POA: Diagnosis not present

## 2021-03-24 DIAGNOSIS — E782 Mixed hyperlipidemia: Secondary | ICD-10-CM | POA: Diagnosis not present

## 2021-03-24 DIAGNOSIS — I10 Essential (primary) hypertension: Secondary | ICD-10-CM | POA: Diagnosis not present

## 2021-04-19 DIAGNOSIS — U071 COVID-19: Secondary | ICD-10-CM | POA: Diagnosis not present

## 2021-10-11 DIAGNOSIS — Z6841 Body Mass Index (BMI) 40.0 and over, adult: Secondary | ICD-10-CM | POA: Diagnosis not present

## 2021-10-11 DIAGNOSIS — M5126 Other intervertebral disc displacement, lumbar region: Secondary | ICD-10-CM | POA: Diagnosis not present

## 2021-10-11 DIAGNOSIS — I10 Essential (primary) hypertension: Secondary | ICD-10-CM | POA: Diagnosis not present

## 2021-10-16 ENCOUNTER — Ambulatory Visit (HOSPITAL_COMMUNITY)
Admission: RE | Admit: 2021-10-16 | Discharge: 2021-10-16 | Disposition: A | Discharge status: Home / Self Care | Payer: BC Managed Care – PPO | Source: Ambulatory Visit | Attending: Family Medicine | Admitting: Family Medicine

## 2021-10-16 ENCOUNTER — Other Ambulatory Visit: Payer: Self-pay

## 2021-10-16 ENCOUNTER — Other Ambulatory Visit (HOSPITAL_COMMUNITY): Payer: Self-pay | Admitting: Family Medicine

## 2021-10-16 DIAGNOSIS — M5126 Other intervertebral disc displacement, lumbar region: Secondary | ICD-10-CM | POA: Diagnosis not present

## 2021-10-16 DIAGNOSIS — E782 Mixed hyperlipidemia: Secondary | ICD-10-CM | POA: Diagnosis not present

## 2021-10-16 DIAGNOSIS — I10 Essential (primary) hypertension: Secondary | ICD-10-CM | POA: Diagnosis not present

## 2021-10-16 DIAGNOSIS — M2578 Osteophyte, vertebrae: Secondary | ICD-10-CM | POA: Diagnosis not present

## 2021-10-16 DIAGNOSIS — E6609 Other obesity due to excess calories: Secondary | ICD-10-CM | POA: Diagnosis not present

## 2021-10-16 DIAGNOSIS — Z6841 Body Mass Index (BMI) 40.0 and over, adult: Secondary | ICD-10-CM | POA: Diagnosis not present

## 2021-10-16 DIAGNOSIS — E119 Type 2 diabetes mellitus without complications: Secondary | ICD-10-CM | POA: Diagnosis not present

## 2021-10-16 DIAGNOSIS — M545 Low back pain, unspecified: Secondary | ICD-10-CM | POA: Diagnosis not present

## 2021-10-16 DIAGNOSIS — E7849 Other hyperlipidemia: Secondary | ICD-10-CM | POA: Diagnosis not present

## 2021-11-02 DIAGNOSIS — Z6839 Body mass index (BMI) 39.0-39.9, adult: Secondary | ICD-10-CM | POA: Diagnosis not present

## 2021-11-02 DIAGNOSIS — S39012D Strain of muscle, fascia and tendon of lower back, subsequent encounter: Secondary | ICD-10-CM | POA: Diagnosis not present

## 2021-12-07 DIAGNOSIS — I10 Essential (primary) hypertension: Secondary | ICD-10-CM | POA: Diagnosis not present

## 2021-12-07 DIAGNOSIS — Z0000 Encounter for general adult medical examination without abnormal findings: Secondary | ICD-10-CM | POA: Diagnosis not present

## 2021-12-07 DIAGNOSIS — R7309 Other abnormal glucose: Secondary | ICD-10-CM | POA: Diagnosis not present

## 2021-12-07 DIAGNOSIS — Z1331 Encounter for screening for depression: Secondary | ICD-10-CM | POA: Diagnosis not present

## 2021-12-07 DIAGNOSIS — E782 Mixed hyperlipidemia: Secondary | ICD-10-CM | POA: Diagnosis not present

## 2021-12-07 DIAGNOSIS — E119 Type 2 diabetes mellitus without complications: Secondary | ICD-10-CM | POA: Diagnosis not present

## 2021-12-07 DIAGNOSIS — Z6839 Body mass index (BMI) 39.0-39.9, adult: Secondary | ICD-10-CM | POA: Diagnosis not present

## 2022-02-02 DIAGNOSIS — H2513 Age-related nuclear cataract, bilateral: Secondary | ICD-10-CM | POA: Diagnosis not present

## 2022-02-02 DIAGNOSIS — H3554 Dystrophies primarily involving the retinal pigment epithelium: Secondary | ICD-10-CM | POA: Diagnosis not present

## 2022-02-02 DIAGNOSIS — H35033 Hypertensive retinopathy, bilateral: Secondary | ICD-10-CM | POA: Diagnosis not present

## 2022-02-02 DIAGNOSIS — H43823 Vitreomacular adhesion, bilateral: Secondary | ICD-10-CM | POA: Diagnosis not present

## 2022-08-06 DIAGNOSIS — H35033 Hypertensive retinopathy, bilateral: Secondary | ICD-10-CM | POA: Diagnosis not present

## 2022-08-06 DIAGNOSIS — H353131 Nonexudative age-related macular degeneration, bilateral, early dry stage: Secondary | ICD-10-CM | POA: Diagnosis not present

## 2022-08-06 DIAGNOSIS — H2513 Age-related nuclear cataract, bilateral: Secondary | ICD-10-CM | POA: Diagnosis not present

## 2022-08-06 DIAGNOSIS — H43823 Vitreomacular adhesion, bilateral: Secondary | ICD-10-CM | POA: Diagnosis not present

## 2022-08-30 DIAGNOSIS — Z0000 Encounter for general adult medical examination without abnormal findings: Secondary | ICD-10-CM | POA: Diagnosis not present

## 2022-08-30 DIAGNOSIS — E119 Type 2 diabetes mellitus without complications: Secondary | ICD-10-CM | POA: Diagnosis not present

## 2022-08-30 DIAGNOSIS — I10 Essential (primary) hypertension: Secondary | ICD-10-CM | POA: Diagnosis not present

## 2022-08-30 DIAGNOSIS — E782 Mixed hyperlipidemia: Secondary | ICD-10-CM | POA: Diagnosis not present

## 2022-08-30 DIAGNOSIS — Z6839 Body mass index (BMI) 39.0-39.9, adult: Secondary | ICD-10-CM | POA: Diagnosis not present

## 2022-08-30 DIAGNOSIS — Z6841 Body Mass Index (BMI) 40.0 and over, adult: Secondary | ICD-10-CM | POA: Diagnosis not present

## 2022-08-30 DIAGNOSIS — E7849 Other hyperlipidemia: Secondary | ICD-10-CM | POA: Diagnosis not present

## 2022-10-02 DIAGNOSIS — Z6839 Body mass index (BMI) 39.0-39.9, adult: Secondary | ICD-10-CM | POA: Diagnosis not present

## 2022-10-02 DIAGNOSIS — E6609 Other obesity due to excess calories: Secondary | ICD-10-CM | POA: Diagnosis not present

## 2022-10-02 DIAGNOSIS — J069 Acute upper respiratory infection, unspecified: Secondary | ICD-10-CM | POA: Diagnosis not present

## 2023-02-05 DIAGNOSIS — E782 Mixed hyperlipidemia: Secondary | ICD-10-CM | POA: Diagnosis not present

## 2023-02-05 DIAGNOSIS — I10 Essential (primary) hypertension: Secondary | ICD-10-CM | POA: Diagnosis not present

## 2023-02-05 DIAGNOSIS — E119 Type 2 diabetes mellitus without complications: Secondary | ICD-10-CM | POA: Diagnosis not present

## 2023-02-05 DIAGNOSIS — J209 Acute bronchitis, unspecified: Secondary | ICD-10-CM | POA: Diagnosis not present

## 2023-02-05 DIAGNOSIS — R6889 Other general symptoms and signs: Secondary | ICD-10-CM | POA: Diagnosis not present

## 2023-02-05 DIAGNOSIS — Z20828 Contact with and (suspected) exposure to other viral communicable diseases: Secondary | ICD-10-CM | POA: Diagnosis not present

## 2023-02-05 DIAGNOSIS — E6609 Other obesity due to excess calories: Secondary | ICD-10-CM | POA: Diagnosis not present

## 2023-02-05 DIAGNOSIS — Z6837 Body mass index (BMI) 37.0-37.9, adult: Secondary | ICD-10-CM | POA: Diagnosis not present

## 2023-02-05 DIAGNOSIS — J069 Acute upper respiratory infection, unspecified: Secondary | ICD-10-CM | POA: Diagnosis not present

## 2023-02-15 DIAGNOSIS — E7849 Other hyperlipidemia: Secondary | ICD-10-CM | POA: Diagnosis not present

## 2023-02-15 DIAGNOSIS — Z0000 Encounter for general adult medical examination without abnormal findings: Secondary | ICD-10-CM | POA: Diagnosis not present

## 2023-02-15 DIAGNOSIS — R7309 Other abnormal glucose: Secondary | ICD-10-CM | POA: Diagnosis not present

## 2023-02-15 DIAGNOSIS — E119 Type 2 diabetes mellitus without complications: Secondary | ICD-10-CM | POA: Diagnosis not present

## 2023-05-31 DIAGNOSIS — S39012A Strain of muscle, fascia and tendon of lower back, initial encounter: Secondary | ICD-10-CM | POA: Diagnosis not present

## 2023-05-31 DIAGNOSIS — M5126 Other intervertebral disc displacement, lumbar region: Secondary | ICD-10-CM | POA: Diagnosis not present

## 2023-05-31 DIAGNOSIS — E6609 Other obesity due to excess calories: Secondary | ICD-10-CM | POA: Diagnosis not present

## 2023-05-31 DIAGNOSIS — Z6833 Body mass index (BMI) 33.0-33.9, adult: Secondary | ICD-10-CM | POA: Diagnosis not present

## 2023-06-26 IMAGING — DX DG LUMBAR SPINE 2-3V
3 series · 3 of 3 positions shown · non-contrast
Comparison: None.

CLINICAL DATA: Low back pain.

EXAM:
LUMBAR SPINE - 2-3 VIEW

[l-spine ap]
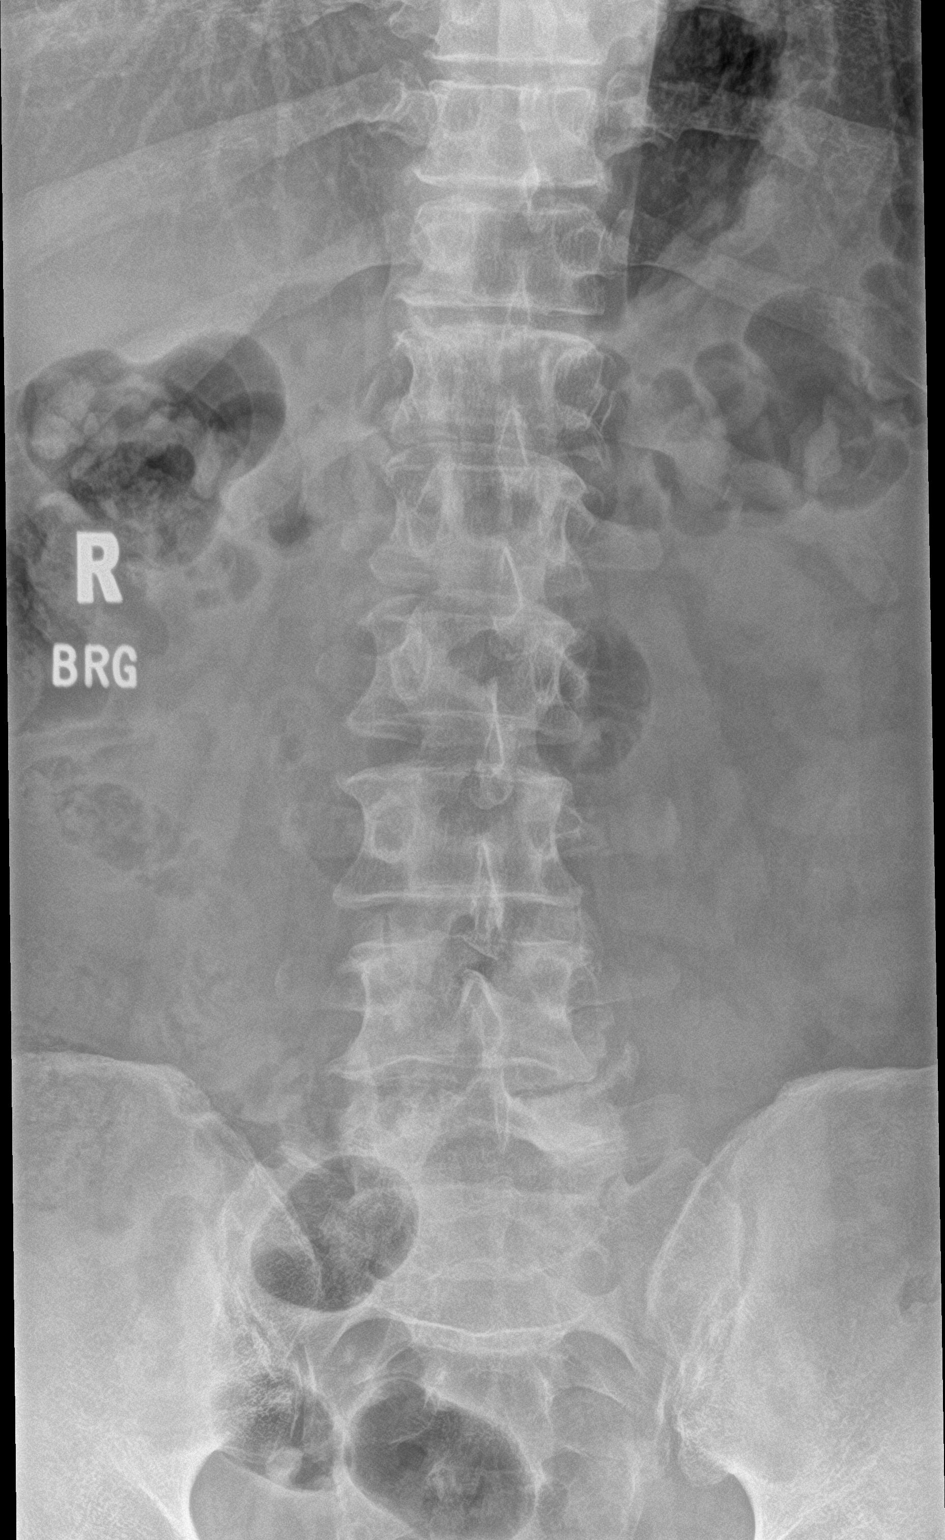

[l-spine lat]
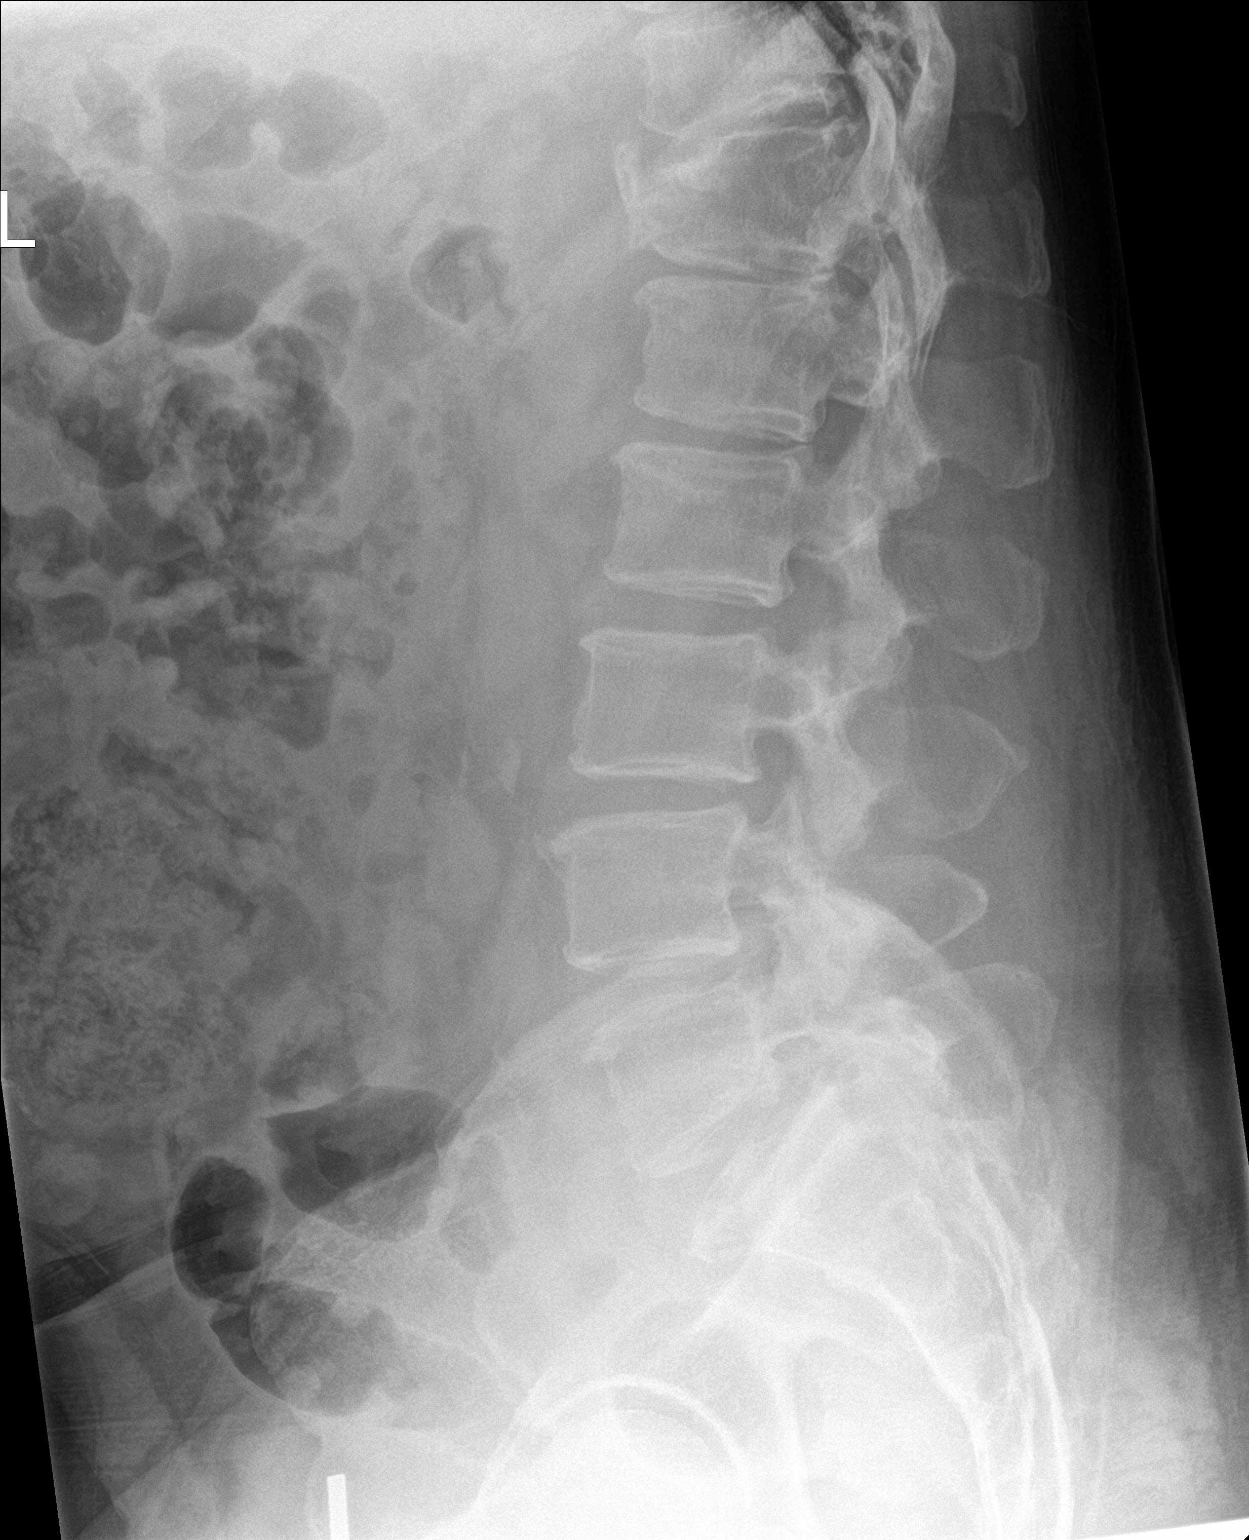

[l-spine spot]
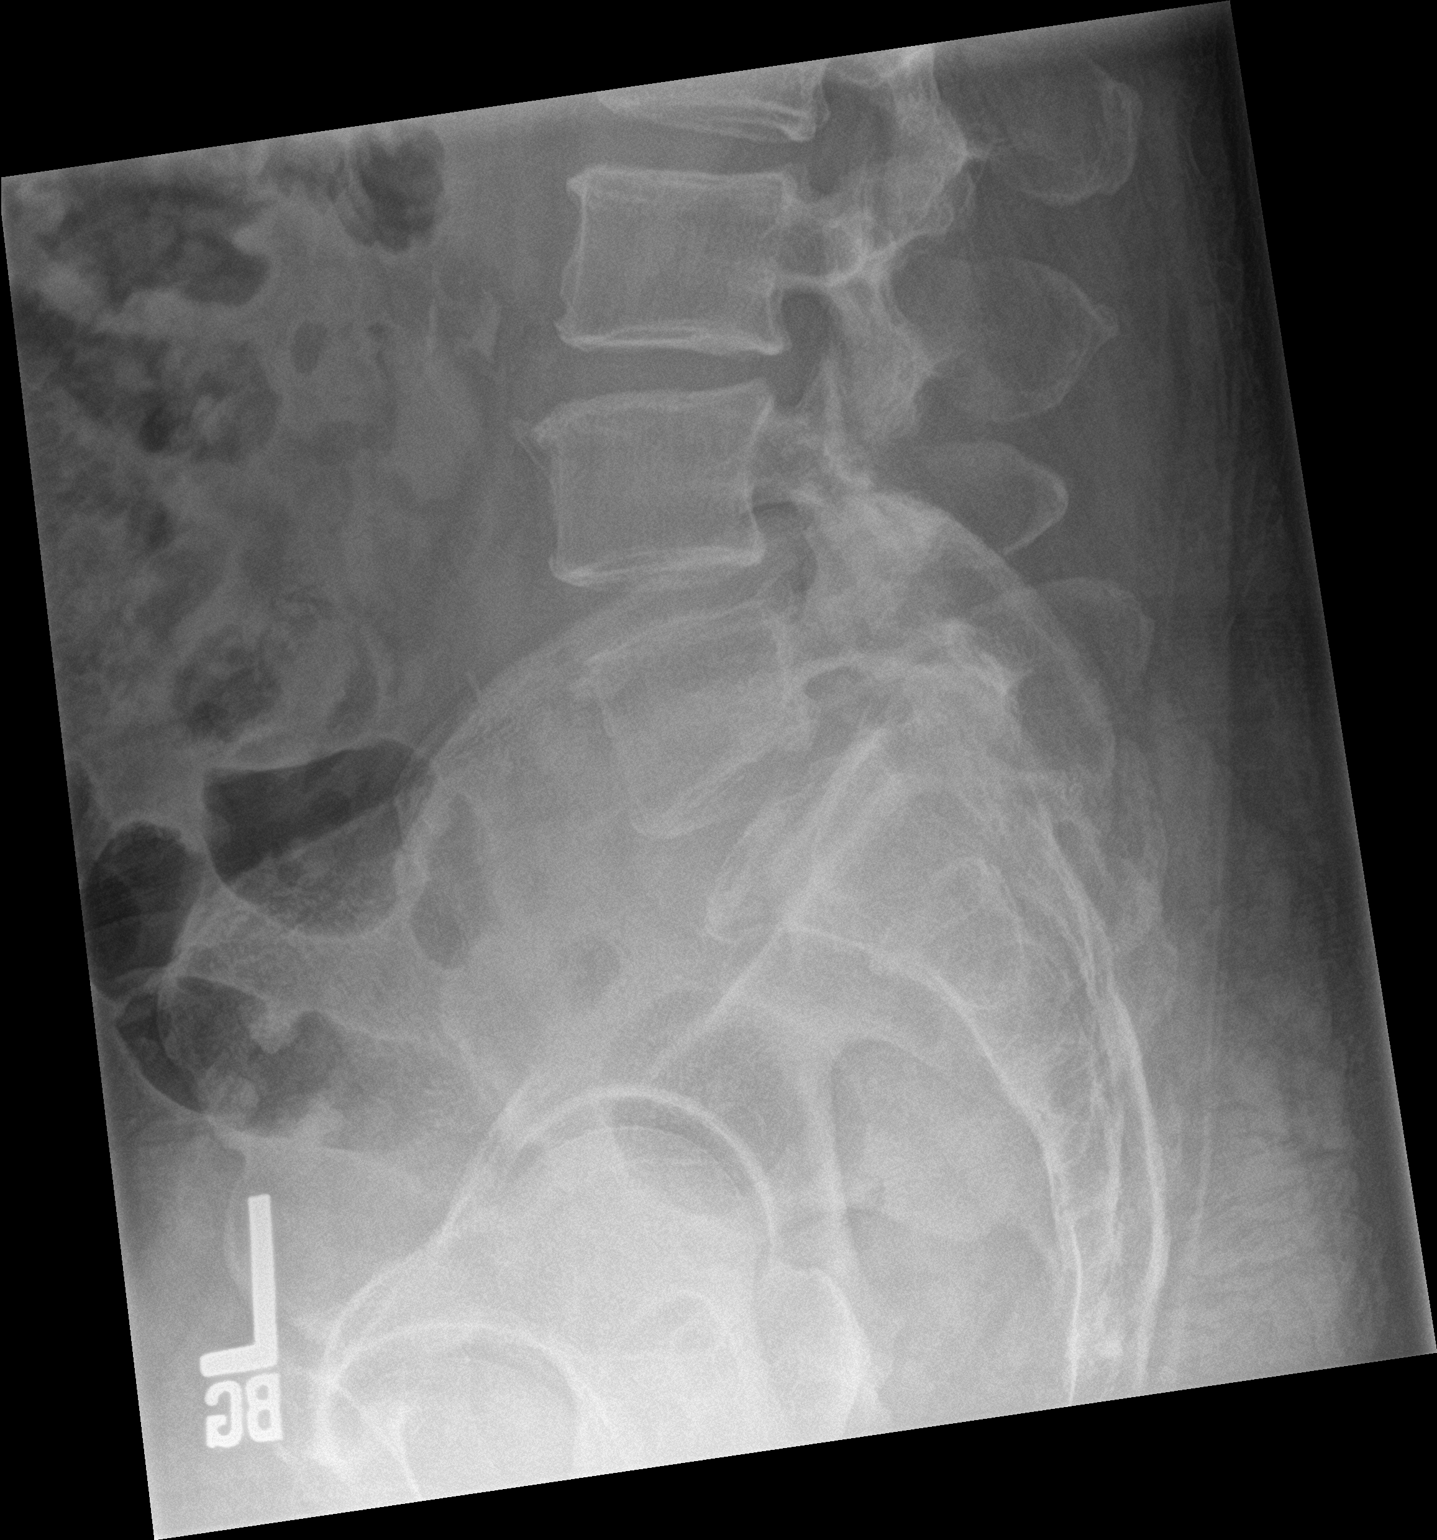

[3 of 3 positions shown; findings below may reference images not displayed]

FINDINGS: There is 4 mm of anterolisthesis at L5-S1. There are mild
degenerative changes at L L4-L5 and L5-S1 with mild disc space
narrowing and endplate osteophyte formation. Disc spaces are
otherwise maintained. There are minimal degenerative endplate
osteophytes throughout the remaining lumbar levels. Soft tissues are
within normal limits.
IMPRESSION: 1. Grade 1 anterolisthesis at L4-L5 is favored as degenerative.
2. Mild degenerative changes at L4-L5 and L5-S1.

## 2023-08-28 DIAGNOSIS — Z0000 Encounter for general adult medical examination without abnormal findings: Secondary | ICD-10-CM | POA: Diagnosis not present

## 2023-08-28 DIAGNOSIS — E119 Type 2 diabetes mellitus without complications: Secondary | ICD-10-CM | POA: Diagnosis not present

## 2023-08-28 DIAGNOSIS — I10 Essential (primary) hypertension: Secondary | ICD-10-CM | POA: Diagnosis not present

## 2023-08-28 DIAGNOSIS — E7849 Other hyperlipidemia: Secondary | ICD-10-CM | POA: Diagnosis not present

## 2023-08-30 DIAGNOSIS — Z23 Encounter for immunization: Secondary | ICD-10-CM | POA: Diagnosis not present

## 2023-08-30 DIAGNOSIS — Z0000 Encounter for general adult medical examination without abnormal findings: Secondary | ICD-10-CM | POA: Diagnosis not present

## 2023-08-30 DIAGNOSIS — E1159 Type 2 diabetes mellitus with other circulatory complications: Secondary | ICD-10-CM | POA: Diagnosis not present

## 2023-08-30 DIAGNOSIS — Z6832 Body mass index (BMI) 32.0-32.9, adult: Secondary | ICD-10-CM | POA: Diagnosis not present

## 2023-08-30 DIAGNOSIS — E6609 Other obesity due to excess calories: Secondary | ICD-10-CM | POA: Diagnosis not present

## 2023-08-30 DIAGNOSIS — I10 Essential (primary) hypertension: Secondary | ICD-10-CM | POA: Diagnosis not present

## 2023-08-30 DIAGNOSIS — E782 Mixed hyperlipidemia: Secondary | ICD-10-CM | POA: Diagnosis not present

## 2023-08-30 DIAGNOSIS — Z1331 Encounter for screening for depression: Secondary | ICD-10-CM | POA: Diagnosis not present

## 2023-12-01 DIAGNOSIS — R030 Elevated blood-pressure reading, without diagnosis of hypertension: Secondary | ICD-10-CM | POA: Diagnosis not present

## 2023-12-01 DIAGNOSIS — Z6831 Body mass index (BMI) 31.0-31.9, adult: Secondary | ICD-10-CM | POA: Diagnosis not present

## 2023-12-01 DIAGNOSIS — E669 Obesity, unspecified: Secondary | ICD-10-CM | POA: Diagnosis not present

## 2023-12-01 DIAGNOSIS — U071 COVID-19: Secondary | ICD-10-CM | POA: Diagnosis not present

## 2023-12-11 DIAGNOSIS — D124 Benign neoplasm of descending colon: Secondary | ICD-10-CM | POA: Diagnosis not present

## 2023-12-11 DIAGNOSIS — Z1211 Encounter for screening for malignant neoplasm of colon: Secondary | ICD-10-CM | POA: Diagnosis not present

## 2023-12-17 DIAGNOSIS — Z23 Encounter for immunization: Secondary | ICD-10-CM | POA: Diagnosis not present

## 2024-04-03 DIAGNOSIS — E1159 Type 2 diabetes mellitus with other circulatory complications: Secondary | ICD-10-CM | POA: Diagnosis not present

## 2024-04-03 DIAGNOSIS — I10 Essential (primary) hypertension: Secondary | ICD-10-CM | POA: Diagnosis not present

## 2024-04-03 DIAGNOSIS — Z6832 Body mass index (BMI) 32.0-32.9, adult: Secondary | ICD-10-CM | POA: Diagnosis not present

## 2024-04-03 DIAGNOSIS — E782 Mixed hyperlipidemia: Secondary | ICD-10-CM | POA: Diagnosis not present

## 2024-04-03 DIAGNOSIS — E6609 Other obesity due to excess calories: Secondary | ICD-10-CM | POA: Diagnosis not present
